# Patient Record
Sex: Male | Born: 1960 | Race: White | Hispanic: No | Marital: Married | Smoking: Former smoker
Health system: Southern US, Community
[De-identification: ages and names within clinical notes are randomized; demographics above are authoritative.]

## PROBLEM LIST (undated history)

## (undated) DIAGNOSIS — M7752 Other enthesopathy of left foot: Secondary | ICD-10-CM

## (undated) DIAGNOSIS — I371 Nonrheumatic pulmonary valve insufficiency: Secondary | ICD-10-CM

## (undated) DIAGNOSIS — I252 Old myocardial infarction: Secondary | ICD-10-CM

## (undated) DIAGNOSIS — I2510 Atherosclerotic heart disease of native coronary artery without angina pectoris: Secondary | ICD-10-CM

## (undated) DIAGNOSIS — F419 Anxiety disorder, unspecified: Secondary | ICD-10-CM

## (undated) DIAGNOSIS — Z9889 Other specified postprocedural states: Secondary | ICD-10-CM

## (undated) DIAGNOSIS — K5792 Diverticulitis of intestine, part unspecified, without perforation or abscess without bleeding: Secondary | ICD-10-CM

## (undated) DIAGNOSIS — I519 Heart disease, unspecified: Secondary | ICD-10-CM

## (undated) DIAGNOSIS — K219 Gastro-esophageal reflux disease without esophagitis: Secondary | ICD-10-CM

## (undated) DIAGNOSIS — E7800 Pure hypercholesterolemia, unspecified: Secondary | ICD-10-CM

## (undated) HISTORY — DX: Other specified postprocedural states: Z98.890

## (undated) HISTORY — DX: Heart disease, unspecified: I51.9

## (undated) HISTORY — DX: Anxiety disorder, unspecified: F41.9

## (undated) HISTORY — DX: Atherosclerotic heart disease of native coronary artery without angina pectoris: I25.10

## (undated) HISTORY — PX: CARDIAC SURGERY: SHX584

## (undated) HISTORY — DX: Nonrheumatic pulmonary valve insufficiency: I37.1

## (undated) HISTORY — PX: CARDIAC CATHETERIZATION: SHX172

## (undated) HISTORY — DX: Gastro-esophageal reflux disease without esophagitis: K21.9

## (undated) HISTORY — DX: Other enthesopathy of left foot and ankle: M77.52

---
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
## 1991-03-19 HISTORY — PX: OTHER SURGICAL HISTORY: SHX169

## 2012-05-16 DIAGNOSIS — I2510 Atherosclerotic heart disease of native coronary artery without angina pectoris: Secondary | ICD-10-CM

## 2012-05-16 HISTORY — DX: Atherosclerotic heart disease of native coronary artery without angina pectoris: I25.10

## 2012-06-03 ENCOUNTER — Ambulatory Visit: Payer: Self-pay | Admitting: Internal Medicine

## 2012-06-03 LAB — COMPREHENSIVE METABOLIC PANEL
Albumin: 3.9 g/dL (ref 3.4–5.0)
Alkaline Phosphatase: 84 U/L (ref 50–136)
Anion Gap: 7 (ref 7–16)
BUN: 13 mg/dL (ref 7–18)
Bilirubin,Total: 0.4 mg/dL (ref 0.2–1.0)
Co2: 25 mmol/L (ref 21–32)
EGFR (African American): >60
Potassium: 3.2 mmol/L — ABNORMAL LOW (ref 3.5–5.1)
SGPT (ALT): 35 U/L (ref 12–78)
Total Protein: 7.3 g/dL (ref 6.4–8.2)

## 2012-06-03 LAB — PROTIME-INR: Prothrombin Time: 12.4 secs (ref 11.5–14.7)

## 2012-06-03 LAB — CBC
MCH: 27.9 pg (ref 26.0–34.0)
MCV: 84 fL (ref 80–100)
RDW: 15 % — ABNORMAL HIGH (ref 11.5–14.5)

## 2012-06-03 LAB — TROPONIN I: Troponin-I: <0.02 ng/mL

## 2012-06-18 LAB — BASIC METABOLIC PANEL
BUN: 7 mg/dL (ref 4–21)
Creatinine: 1 mg/dL (ref 0.6–1.3)
GLUCOSE: 91 mg/dL
POTASSIUM: 3.5 mmol/L (ref 3.4–5.3)
Sodium: 139 mmol/L (ref 137–147)

## 2012-06-18 LAB — CBC AND DIFFERENTIAL
HCT: 38 % — AB (ref 41–53)
Hemoglobin: 12.8 g/dL — AB (ref 13.5–17.5)
PLATELETS: 249 10*3/uL (ref 150–399)

## 2012-06-18 LAB — TSH: TSH: 1.66 u[IU]/mL (ref 0.41–5.90)

## 2012-06-18 LAB — HEPATIC FUNCTION PANEL
ALT: 39 U/L (ref 10–40)
AST: 22 U/L (ref 14–40)

## 2012-06-29 ENCOUNTER — Encounter: Payer: Self-pay | Admitting: Cardiovascular Disease

## 2012-07-16 ENCOUNTER — Encounter: Payer: Self-pay | Admitting: Cardiovascular Disease

## 2012-07-26 DIAGNOSIS — I371 Nonrheumatic pulmonary valve insufficiency: Secondary | ICD-10-CM

## 2012-07-26 DIAGNOSIS — I519 Heart disease, unspecified: Secondary | ICD-10-CM

## 2012-07-26 HISTORY — DX: Heart disease, unspecified: I51.9

## 2012-07-26 HISTORY — DX: Nonrheumatic pulmonary valve insufficiency: I37.1

## 2012-07-28 DIAGNOSIS — Z9889 Other specified postprocedural states: Secondary | ICD-10-CM

## 2012-07-28 HISTORY — DX: Other specified postprocedural states: Z98.890

## 2012-08-19 ENCOUNTER — Encounter: Payer: Self-pay | Admitting: Cardiovascular Disease

## 2012-08-19 ENCOUNTER — Ambulatory Visit (INDEPENDENT_AMBULATORY_CARE_PROVIDER_SITE_OTHER): Payer: 59 | Admitting: Cardiovascular Disease

## 2012-08-19 VITALS — BP 110/72 | HR 56 | Ht 74.0 in | Wt 205.0 lb

## 2012-08-19 DIAGNOSIS — Z720 Tobacco use: Secondary | ICD-10-CM

## 2012-08-19 DIAGNOSIS — Z79899 Other long term (current) drug therapy: Secondary | ICD-10-CM

## 2012-08-19 DIAGNOSIS — I119 Hypertensive heart disease without heart failure: Secondary | ICD-10-CM

## 2012-08-19 DIAGNOSIS — E785 Hyperlipidemia, unspecified: Secondary | ICD-10-CM | POA: Insufficient documentation

## 2012-08-19 DIAGNOSIS — E782 Mixed hyperlipidemia: Secondary | ICD-10-CM

## 2012-08-19 DIAGNOSIS — I2510 Atherosclerotic heart disease of native coronary artery without angina pectoris: Secondary | ICD-10-CM

## 2012-08-19 DIAGNOSIS — F17200 Nicotine dependence, unspecified, uncomplicated: Secondary | ICD-10-CM

## 2012-08-19 DIAGNOSIS — E559 Vitamin D deficiency, unspecified: Secondary | ICD-10-CM

## 2012-08-19 DIAGNOSIS — I2119 ST elevation (STEMI) myocardial infarction involving other coronary artery of inferior wall: Secondary | ICD-10-CM

## 2012-08-19 MED ORDER — LISINOPRIL 2.5 MG PO TABS: 2.5000 mg | ORAL_TABLET | Freq: Every day | ORAL | Status: DC

## 2012-08-19 NOTE — Progress Notes (Signed)
Patient ID: Aaron Kelley, male   DOB: Mar 16, 1961, 52 y.o.   MRN: [id number]  PATIENT PROFILE: Aaron Kelley presents to the office for status with Cardiologic care with me following his recent inferior wall myocardial infarction and intervention done at [facility]. He is referred through the courtesy of Dr. Loma Sender.   HPI: Aaron Kelley denies any previous history of known coronary artery disease. 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 On 06/03/2012 while working kept experiencing recurrent episodes of chest discomfort. Ultimately, he was developing significant shortness of breath. After having chest pain off and on for most of the day he ultimately presented to [facility] and initial catheterization was done acutely by Dr Juliann Pares.  An attempted intervention to his subtotally occluded right coronary artery was unsuccessful and the patient was therefore transported to [facility] and subsequently underwent successful intervention with placement of two 3.0x24 mm Promus premier drug-eluting stents. He apparently was ultimately discharge on aspirin, atorvastatin 80 mg, and Lopressor but he develop significant issues with heaviness and fatigability. He saw the physician assistant on several occasions at [facility] and ultimately he was taken off his beta blocker therapy. An echo Doppler study was done on May 8 which showed an ejection fraction of approximately 50%. 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 Apparently several days later on Jul 28 2012 do to some recurrent chest discomfort and T-wave changes he underwent repeat cardiac catheterization at [facility]. This showed patent stents in the proximal RCA. He did have a long tubular 30% stenosis in the mid LAD. Other vessels were normal. The patient has subsequently seen Dr. Vear Clock. He now presents for cardiology referral for subsequent post-MI management and  care.  Past Medical History  Diagnosis Date  . Coronary artery disease 05/2012    MI tx. @ [facility] 2 stents placed     Past Surgical History  Procedure Laterality Date  . Rt. acl surgery  1993    [facility]  . Appendectomy  1972    Allergies not on file  Current Outpatient Prescriptions  Medication Sig Dispense Refill  . aspirin 81 MG tablet Take 81 mg by mouth daily.      Marland Kitchen atorvastatin (LIPITOR) 40 MG tablet Take 40 mg by mouth daily.      . clopidogrel (PLAVIX) 75 MG tablet Take 75 mg by mouth daily.      . clorazepate (TRANXENE) 7.5 MG tablet Take 7.5 mg by mouth 2 (two) times daily as needed for anxiety. prn      . lisinopril (PRINIVIL,ZESTRIL) 2.5 MG tablet Take 1 tablet (2.5 mg total) by mouth at bedtime.  30 tablet  6  . metoprolol tartrate (LOPRESSOR) 25 MG tablet Take 25 mg by mouth daily. 1/2 tablet      . nitroGLYCERIN (NITROSTAT) 0.4 MG SL tablet Place 0.4 mg under the tongue every 5 (five) minutes as needed for chest pain.       No current facility-administered medications for this visit.    Socially he is married. 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 Mostly, he did have a history of alcohol and drug use but he quit approximately 14 years ago.  Family History  Problem Relation Age of Onset  . Heart disease Father   . Heart attack Father   . Heart disease Brother   . Heart attack  Brother   . Heart disease Paternal Grandfather     ROS is negative for fever chills night sweats. He denies any further lightheadedness. He denies recent chest pain. He is unaware of palpitations. He denies wheezing. He denies abdominal pain. He denies bleeding. He denies rash. He denies myalgias. There are no paresthesias. Other system review is negative.  PE BP 110/72  Pulse 56  Ht 6\' 2"  (1.88 m)  Wt 205 lb (92.987 kg)  BMI 26.31 kg/m2 orthostatic blood pressure checks by me today reveal a supine blood pressure of 102/70 and  actually his blood pressure increased to 110/72 with standing. General: Alert, oriented, no distress.  HEENT: Normocephalic, atraumatic. Pupils round and reactive; sclera anicteric; Fundi no hemorrhages or exudates. Nose without nasal septal hypertrophy Mouth/Parynx benign; Mallinpatti scale 3 Neck: No JVD, no carotid briuts Lungs: Decreased breath sounds diffusely; no wheezing or rales Heart: RRR, s1 s2 normal 1/6 systolic murmur Abdomen: soft, nontender; no hepatosplenomehaly, BS+; abdominal aorta nontender and not dilated by palpation. Pulses 2+ catheterization sites well-healed. Extremities: no clubbinbg cyanosis or edema, Homan's sign negative  Neurologic: grossly nonfocal    ECG: Sinus bradycardia at 56 beats per minute. Evidence for old inferior infarct with Q wave in 3 T-wave inversion in 3 and F and also T-wave inversion V1 through V4 there  LABS:  BMET No results found for this basename: na, k, cl, co2, glucose, bun, creatinine, calcium, gfrnonaa, gfraa     Hepatic Function Panel  No results found for this basename: prot, albumin, ast, alt, alkphos, bilitot, bilidir, ibili     CBC No results found for this basename: wbc, rbc, hgb, hct, plt, mcv, mch, mchc, rdw, neutrabs, lymphsabs, monoabs, eosabs, basosabs     BNP No results found for this basename: probnp    Lipid Panel  No results found for this basename: chol, trig, hdl, cholhdl, vldl, ldlcalc     RADIOLOGY: No results found.   ASSESSMENT AND PLAN: A long discussion with Mr. Filip and his wife. I reviewed the records from [facility] well as [facility]. Aaron Kelley suffered an inferior wall myocardial infarction on 06/03/2012. Primary intervention was difficult but ultimately her failed attempt at [facility] he underwent insertion of 2 Ms. premier DES stents at [facility] into his proximal right coronary artery. The patient does have mild concomitant coronary artery disease with smooth  tubular narrowing of his middle LAD of 30%. I commended him on his smoking cessation. Presently, his blood pressure seems to be controlled and he does not have orthostatic symptoms. I'm electing to add a trial of very low dose ACE inhibition and will start him on lisinopril to take one half of a 2.5 mg for the next several evenings and if he tolerates this we will then increase this to 2.5 mg at bedtime. He is now on atorvastatin at 40 mg. I will check a complete set of laboratory and also an NMR lipoprotein to make certain he is meeting optimal management with reference to LDL particle #. In addition, since he is on aspirin and Plavix, I will check a P2Y12 assessment to make certain he is a good Plavix responder if not he may need to be changed to a different antiplatelet agent. I will see him back in the office in 6 weeks for followup evaluation    Lennette Bihari, MD, [facility] 08/19/2012 12:05 PM

## 2012-08-19 NOTE — Patient Instructions (Addendum)
Your physician recommends that you return for lab work.  Your physician recommends that you schedule a follow-up appointment in: 6 weeks.  Your physician has recommended you make the following change in your medication: start Lisinopril 2.5 mg. This has already been sent to your pharmacy.

## 2012-08-20 ENCOUNTER — Ambulatory Visit (HOSPITAL_COMMUNITY)
Admission: AD | Admit: 2012-08-20 | Discharge: 2012-08-20 | Disposition: A | Discharge status: Home / Self Care | Payer: 59 | Source: Ambulatory Visit | Attending: Cardiovascular Disease | Admitting: Cardiovascular Disease

## 2012-08-20 DIAGNOSIS — I252 Old myocardial infarction: Secondary | ICD-10-CM | POA: Insufficient documentation

## 2012-08-20 DIAGNOSIS — I2510 Atherosclerotic heart disease of native coronary artery without angina pectoris: Secondary | ICD-10-CM | POA: Insufficient documentation

## 2012-08-20 DIAGNOSIS — Z7902 Long term (current) use of antithrombotics/antiplatelets: Secondary | ICD-10-CM | POA: Insufficient documentation

## 2012-08-20 DIAGNOSIS — Z79899 Other long term (current) drug therapy: Secondary | ICD-10-CM | POA: Insufficient documentation

## 2012-08-20 DIAGNOSIS — Z7982 Long term (current) use of aspirin: Secondary | ICD-10-CM | POA: Insufficient documentation

## 2012-08-20 LAB — COMPREHENSIVE METABOLIC PANEL
BUN: 12 mg/dL (ref 6–23)
CO2: 23 mEq/L (ref 19–32)
Calcium: 9.3 mg/dL (ref 8.4–10.5)
Creatinine, Ser: 1.12 mg/dL (ref 0.50–1.35)
GFR calc Af Amer: 84 mL/min — ABNORMAL LOW (ref >90)
GFR calc non Af Amer: 73 mL/min — ABNORMAL LOW (ref >90)
Glucose, Bld: 123 mg/dL — ABNORMAL HIGH (ref 70–99)

## 2012-08-20 LAB — CBC
Hemoglobin: 14.1 g/dL (ref 13.0–17.0)
MCH: 28.1 pg (ref 26.0–34.0)
MCV: 81.9 fL (ref 78.0–100.0)
Platelets: 152 10*3/uL (ref 150–400)
RBC: 5.02 MIL/uL (ref 4.22–5.81)

## 2012-08-20 LAB — VITAMIN D 25 HYDROXY (VIT D DEFICIENCY, FRACTURES): Vit D, 25-Hydroxy: 50 ng/mL (ref 30–89)

## 2012-08-25 ENCOUNTER — Telehealth: Payer: Self-pay | Admitting: Cardiovascular Disease

## 2012-08-25 LAB — MISCELLANEOUS TEST

## 2012-08-25 NOTE — Telephone Encounter (Signed)
Pt calling about lab results  

## 2012-08-25 NOTE — Telephone Encounter (Signed)
Message forwarded to W. Waddell, CMA.  

## 2012-08-26 ENCOUNTER — Telehealth: Payer: Self-pay | Admitting: *Deleted

## 2012-08-26 NOTE — Telephone Encounter (Signed)
Called patient to inform him that Dr. Tresa Endo hasn't given me his lab results. While speaking with patient, he informed me that he is having problems with the lisinopril prescription that he was given. Recommended sooner follow up appointment to evaluate his symptoms. Said to patient that we could have him to see Dr. Tresa Endo or if not, then a extender. Patient stated in  No circumstances will he see a "PA". Told patient that's fine, i will have the scheduler to call him with appointment to see Dr. Tresa Endo. Saff message sent to Inetta Fermo to schedule patient appointment.

## 2012-08-26 NOTE — Telephone Encounter (Signed)
Taken care of

## 2012-09-07 ENCOUNTER — Encounter: Payer: Self-pay | Admitting: Cardiovascular Disease

## 2012-09-07 ENCOUNTER — Ambulatory Visit (INDEPENDENT_AMBULATORY_CARE_PROVIDER_SITE_OTHER): Payer: 59 | Admitting: Cardiovascular Disease

## 2012-09-07 VITALS — BP 110/60 | HR 53 | Ht 73.5 in | Wt 203.1 lb

## 2012-09-07 DIAGNOSIS — I2510 Atherosclerotic heart disease of native coronary artery without angina pectoris: Secondary | ICD-10-CM

## 2012-09-07 NOTE — Patient Instructions (Signed)
Your physician recommends that you schedule a follow-up appointment in: 3 months  No changes have been made today.

## 2012-09-14 ENCOUNTER — Encounter: Payer: Self-pay | Admitting: Cardiovascular Disease

## 2012-09-14 NOTE — Progress Notes (Signed)
Patient ID: Aaron Kelley, male   DOB: January 24, 1961, 52 y.o.   MRN: [id number]     HPI: Aaron Kelley presents to the office today in followup of my initial cardiology evaluation on  08/19/2012.  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 After having chest pain off and on for most of the day he ultimately presented to [facility] and initial catheterization was done acutely by Dr Juliann Pares.  An attempted intervention to his subtotally occluded right coronary artery was unsuccessful and the patient was therefore transported to [facility] and subsequently underwent successful intervention with placement of two 3.0x24 mm Promus premier drug-eluting stents. He apparently was ultimately discharge on aspirin, atorvastatin 80 mg, and Lopressor but he develop significant issues with heaviness and fatigability. He saw the physician assistant on several occasions at [facility] and ultimately he was taken off his beta blocker therapy. An echo Doppler study was done on May 8 which showed an ejection fraction of approximately 50%. 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 Apparently several days later on Jul 28 2012 do to some recurrent chest discomfort and T-wave changes he underwent repeat cardiac catheterization at [facility]. This showed patent stents in the proximal RCA. He did have a long tubular 30% stenosis in the mid LAD. Other vessels were normal. The patient had subsequently seen Dr. Loma Sender and was referred to me for subsequent post-MI management and care.  When I saw him on June 4 I did recommend an initial trial of ACE inhibitor and started him on low-dose lisinopril. Apparently he could not tolerate this small dose and experienced some recurrent chest pain. I did refer him for an NMR   lipoprofile  which revealed a calculated LDL of 41 with an LDL particle number of 711. HDL cholesterol was low at 34 and his HDL particle number remains low at 24.9. Triglycerides are excellent at 80. Insulin resistance was slightly elevated at 52. Hemoglobin was 14.1 hematocrit 41.1 BUN 12 a 1.12. Fasting glucose mildly elevated at 123. I also performed a  P2Y12 assessment of platelet function since he was on Plavix and this revealed adequate platelet response with a value of 191. D. level was normal at 50. TSH was 2.602.  Aaron Kelley well without recurrent chest pain or dizziness since he has been off lisinopril to he denies recent dizziness. Apparently when he was taking lisinopril he was working on extremely hot days suggesting the possibility of some hypotension in the etiology of his symptoms.   Past Medical History  Diagnosis Date  . Coronary artery disease 05/2012    MI tx. @ [facility] 2 stents placed     Past Surgical History  Procedure Laterality Date  . Rt. acl surgery  1993    [facility]  . Appendectomy  1972    Allergies  Allergen Reactions  . Lisinopril Other (See Comments)    Severe chest pain and fatigue    Current Outpatient Prescriptions  Medication Sig Dispense Refill  . aspirin 81 MG tablet Take 81 mg by mouth daily.      Marland Kitchen atorvastatin (LIPITOR) 40 MG tablet Take 40 mg by mouth daily.      . clopidogrel (PLAVIX) 75 MG tablet Take 75 mg by mouth daily.      . clorazepate (TRANXENE) 7.5 MG tablet  Take 7.5 mg by mouth 2 (two) times daily as needed for anxiety. prn      . metoprolol tartrate (LOPRESSOR) 25 MG tablet Take 12.5 mg by mouth daily.       . nitroGLYCERIN (NITROSTAT) 0.4 MG SL tablet Place 0.4 mg under the tongue every 5 (five) minutes as needed for chest pain.      . pantoprazole (PROTONIX) 40 MG tablet Take 1 tablet by mouth daily.       No current facility-administered medications for this visit.    Socially he is married. He works as a Printmaker. He  had smoked for 40 years but quit the day of his heart attack. Mostly, he did have a history of alcohol and drug use but he quit approximately 14 years ago.  Family History  Problem Relation Age of Onset  . Heart disease Father   . Heart attack Father   . Heart disease Brother   . Heart attack Brother   . Heart disease Paternal Grandfather     ROS is negative for fever chills night sweats. He denies any further lightheadedness. He denies recent chest pain. He is unaware of palpitations. He denies wheezing. He denies abdominal pain. He denies bleeding. He denies rash. He denies myalgias. There are no paresthesias. Other system review is negative.  PE BP 110/60  Pulse 53  Ht 6' 1.5" (1.867 m)  Wt 203 lb 1.6 oz (92.126 kg)  BMI 26.43 kg/m2 orthostatic blood pressure checks by me today reveal a supine blood pressure of 102/70 and actually his blood pressure increased to 110/72 with standing. General: Alert, oriented, no distress.  HEENT: Normocephalic, atraumatic. Pupils round and reactive; sclera anicteric; Fundi no hemorrhages or exudates. Nose without nasal septal hypertrophy Mouth/Parynx benign; Mallinpatti scale 3 Neck: No JVD, no carotid briuts Lungs: Decreased breath sounds diffusely; no wheezing or rales Heart: RRR, s1 s2 normal 1/6 systolic murmur Abdomen: soft, nontender; no hepatosplenomehaly, BS+; abdominal aorta nontender and not dilated by palpation. Pulses 2+ catheterization sites well-healed. Extremities: no clubbinbg cyanosis or edema, Homan's sign negative  Neurologic: grossly nonfocal    ECG: Sinus bradycardia at 53 beats per minute. Evidence for old inferior infarct with Q wave in 3 T-wave inversion in 3 and F and also T-wave inversion V1 through V4.  LABS:  BMET    Component Value Date/Time   NA 139 08/20/2012 0700     Hepatic Function Panel     Component Value Date/Time   PROT 6.7 08/20/2012 0700     CBC    Component Value Date/Time   WBC 5.0  08/20/2012 0700     BNP No results found for this basename: probnp    Lipid Panel  No results found for this basename: chol,  trig,  hdl,  cholhdl,  vldl,  ldlcalc     RADIOLOGY: No results found.   ASSESSMENT AND PLAN:  Mr. Foiles suffered an inferior wall myocardial infarction on 06/03/2012. Primary intervention was difficult but ultimately following a failed attempt at [facility] he underwent insertion of 2 DES premier  stents at [facility] into his proximal right coronary artery. The patient does have mild concomitant coronary artery disease with smooth tubular narrowing of his middle LAD of 30%.  Apparently he did not tolerate even a very low dose of  ACE inhibition and this may have been due to hypotension particularly in the setting of extreme heat and humidity while working.  He is now on atorvastatin at 40 mg. His  NMR lipoprotein reveals optimal management with reference to LDL particle number, but his HDL particle number remains low.  He is on dual antiplatelet therapy with low-dose aspirin plus Plavix and P2 Y12 testing has confirmed adequate Plavix responsiveness. He did have a repeat catheterization in May which confirmed patent stent. I did review his laboratory with him in detail. I will see him in 3 months for followup cardiology evaluation.   Lennette Bihari, MD, [facility] 09/14/2012 10:34 AM

## 2012-09-22 ENCOUNTER — Ambulatory Visit: Payer: 59 | Admitting: Cardiovascular Disease

## 2012-09-25 ENCOUNTER — Ambulatory Visit: Payer: 59 | Admitting: Cardiovascular Disease

## 2012-11-10 ENCOUNTER — Ambulatory Visit: Payer: Self-pay

## 2012-11-20 ENCOUNTER — Encounter: Payer: Self-pay | Admitting: Cardiovascular Disease

## 2012-11-20 ENCOUNTER — Ambulatory Visit (INDEPENDENT_AMBULATORY_CARE_PROVIDER_SITE_OTHER): Payer: 59 | Admitting: Cardiovascular Disease

## 2012-11-20 VITALS — BP 118/70 | HR 57 | Ht 73.5 in | Wt 206.8 lb

## 2012-11-20 DIAGNOSIS — E785 Hyperlipidemia, unspecified: Secondary | ICD-10-CM

## 2012-11-20 DIAGNOSIS — I2510 Atherosclerotic heart disease of native coronary artery without angina pectoris: Secondary | ICD-10-CM

## 2012-11-20 DIAGNOSIS — I2119 ST elevation (STEMI) myocardial infarction involving other coronary artery of inferior wall: Secondary | ICD-10-CM

## 2012-11-20 NOTE — Patient Instructions (Signed)
Your physician recommends that you return for lab work in: 6 months.   Your physician has requested that you have en exercise stress myoview. For further information please visit [email or web address] Please follow instruction sheet, as given. 6 MONTHS.  Your physician recommends that you schedule a follow-up appointment in: 6 months.

## 2012-11-20 NOTE — Progress Notes (Signed)
Patient ID: Aaron Kelley, male   DOB: [birth year], 52 y.o.   MRN: [id number]     HPI: Mr. Aaron Kelley presents to the office today in followup cardiology evaluation. 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 After having chest pain off and on for most of the day he ultimately presented to [facility] and initial catheterization was done acutely by Dr Juliann Pares.  An attempted intervention to his subtotally occluded right coronary artery was unsuccessful and the patient was therefore transported to [facility] and subsequently underwent successful intervention with placement of two 3.0x24 mm Promus premier drug-eluting stents. He apparently was ultimately discharge on aspirin, atorvastatin 80 mg, and Lopressor but he develop significant issues with heaviness and fatigability. He saw the physician assistant on several occasions at [facility] and ultimately he was taken off his beta blocker therapy. An echo Doppler study was done on May 8 which showed an ejection fraction of approximately 50%. 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 Apparently several days later on Jul 28 2012 due to  recurrent chest discomfort and T-wave changes he underwent repeat cardiac catheterization at [facility]. This showed patent stents in the proximal RCA. He did have a long tubular 30% stenosis in the mid LAD. Other vessels were normal. The patient had subsequently seen Dr. Loma Sender and was referred to me for subsequent post-MI management and care.  When I saw him on June 4 I did recommend an initial trial of ACE inhibitor and started him on low-dose lisinopril. Apparently he could not tolerate this small dose and experienced some recurrent chest pain. I did refer him for an NMR  lipoprofile  which revealed a calculated LDL of 41 with an LDL  particle number of 711. HDL cholesterol was low at 34 and his HDL particle number remains low at 24.9. Triglycerides are excellent at 80. Insulin resistance was slightly elevated at 52. Hemoglobin was 14.1 hematocrit 41.1 BUN 12 a 1.12. Fasting glucose mildly elevated at 123. I also performed a  P2Y12 assessment of platelet function since he was on Plavix and this revealed adequate platelet response with a value of 191. D. level was normal at 50. TSH was 2.602.  Since I last saw he has continued to do well. His blood pressure has remained in the low normal range. He denies recurrent chest pain. He denies dizziness. He denies tachycardia palpitations. He denies presyncope or syncope. He has had complaints with his left heel bone spur.  Past Medical History  Diagnosis Date  . Coronary artery disease 05/2012    MI tx. @ [facility] 2 stents placed     Past Surgical History  Procedure Laterality Date  . Rt. acl surgery  1993    [facility]  . Appendectomy  1972    Allergies  Allergen Reactions  . Lisinopril Other (See Comments)    Severe chest pain and fatigue    Current Outpatient Prescriptions  Medication Sig Dispense Refill  . aspirin 81 MG tablet Take 81 mg by mouth daily.      Marland Kitchen atorvastatin (LIPITOR) 40 MG tablet Take 40 mg by mouth daily.      . clopidogrel (PLAVIX) 75 MG tablet Take 75 mg by mouth daily.      . clorazepate (TRANXENE) 7.5 MG tablet Take 7.5 mg by mouth 2 (two) times daily as needed  for anxiety. prn      . metoprolol tartrate (LOPRESSOR) 25 MG tablet Take 12.5 mg by mouth daily.       . nitroGLYCERIN (NITROSTAT) 0.4 MG SL tablet Place 0.4 mg under the tongue every 5 (five) minutes as needed for chest pain.      . pantoprazole (PROTONIX) 40 MG tablet Take 1 tablet by mouth daily.       No current facility-administered medications for this visit.    Socially he is married. 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 Mostly,  he did have a history of alcohol and drug use but he quit approximately 14 years ago.  Family History  Problem Relation Age of Onset  . Heart disease Father   . Heart attack Father   . Heart disease Brother   . Heart attack Brother   . Heart disease Paternal Grandfather     ROS is negative for fever chills night sweats. He denies any further lightheadedness. He denies recent chest pain. He is unaware of palpitations. He denies wheezing. He denies abdominal pain. Is no change in bowel or bladder habits. He denies bleeding. He denies rash. He denies myalgias. There are no paresthesias. He does have bone spur in his left heel with significant discomfort. Other system review is negative.  PE BP 118/70  Pulse 57  Ht 6' 1.5" (1.867 m)  Wt 206 lb 12.8 oz (93.804 kg)  BMI 26.91 kg/m2   Repeat blood pressure by me today was 118/70 without orthostatic change General: Alert, oriented, no distress.  HEENT: Normocephalic, atraumatic. Pupils round and reactive; sclera anicteric; Fundi no hemorrhages or exudates. Nose without nasal septal hypertrophy Mouth/Parynx benign; Mallinpatti scale 3 Neck: No JVD, no carotid briuts Lungs: Decreased breath sounds diffusely; no wheezing or rales Heart: RRR, s1 s2 normal 1/6 systolic murmur Abdomen: soft, nontender; no hepatosplenomehaly, BS+; abdominal aorta nontender and not dilated by palpation. Pulses 2+ catheterization sites well-healed. Extremities: no clubbinbg cyanosis or edema, Homan's sign negative  Neurologic: grossly nonfocal    ECG: Sinus bradycardia at 57 beats per minute. Evidence for old inferior infarct with Q wave in 3.  T-wave inversion in 3 and F and also T-wave inversion V1 through V4.  LABS:  BMET    Component Value Date/Time   NA 139 08/20/2012 0700     Hepatic Function Panel     Component Value Date/Time   PROT 6.7 08/20/2012 0700     CBC    Component Value Date/Time   WBC 5.0 08/20/2012 0700     BNP No results found  for this basename: probnp    Lipid Panel  No results found for this basename: chol,  trig,  hdl,  cholhdl,  vldl,  ldlcalc     RADIOLOGY: No results found.   ASSESSMENT AND PLAN:  Mr. Trull suffered an inferior wall myocardial infarction on 06/03/2012. Primary intervention was difficult but ultimately successful at [facility] following a failed attempt at [facility] with insertion of 2 DES premier  stents into his proximal right coronary artery. The patient does have mild concomitant coronary artery disease with smooth tubular narrowing of his middle LAD of 30%.  Apparently he did not tolerate even a very low dose of  ACE inhibition and this may have been due to hypotension particularly in the setting of extreme heat and humidity while working.  He is now on atorvastatin at 40 mg. His  NMR lipoprotein reveals optimal management with reference to LDL particle number, but his HDL particle number remains low.  He is on dual antiplatelet therapy with low-dose aspirin plus Plavix and P2 Y12 testing has confirmed adequate Plavix responsiveness. He remained stable on his current medical regimen. In March 2015 I am recommending he undergo a one-year followup exercise Myoview scan following his initial MI. Prior to that office visit I will also check a CBC compressive metabolic panel, lipid panel and TSH we'll see him back in followup of the above studies and further recommendations will be made at that time.  Lennette Bihari, MD, [facility] 11/20/2012 5:41 PM

## 2013-01-12 ENCOUNTER — Telehealth: Payer: Self-pay | Admitting: Cardiovascular Disease

## 2013-01-12 DIAGNOSIS — E782 Mixed hyperlipidemia: Secondary | ICD-10-CM

## 2013-01-12 NOTE — Telephone Encounter (Signed)
Needs to talk to nurse about Lipitor  Having reactions  Please call  Faxing labwork from yesterday.

## 2013-01-12 NOTE — Telephone Encounter (Signed)
Returned call and pt verified x 2 w/ Jonna, pt's wife.  Stated pt had been having leg pain not long into taking Lipitor.  Stated pt's PCP told them to stop it and pt has not had any pain in his legs for the last 5 days he has been off Lipitor.  Stated they don't want to stop it w/o Dr. Landry Dyke orders, but he cannot take it.  Wants to know if Dr. Tresa Endo recommends something else or what he would like pt to do.  Wife informed Dr. Tresa Endo will be notified for further instructions.  Verbalized understanding and agreed w/ plan.  Message forwarded to Dr. Tresa Endo and labs on cart w/ this note.

## 2013-01-13 NOTE — Telephone Encounter (Signed)
Try crestor 10 mg  Daily or every other day; and recheck lab in 6 weeks if able to tolerate

## 2013-01-14 ENCOUNTER — Telehealth: Payer: Self-pay | Admitting: Cardiovascular Disease

## 2013-01-14 MED ORDER — ROSUVASTATIN CALCIUM 10 MG PO TABS: 10.0000 mg | ORAL_TABLET | Freq: Every day | ORAL | Status: DC

## 2013-01-14 NOTE — Telephone Encounter (Signed)
Dr Tresa Endo put on Crestor.  Cannot afford Crestor  Is there something else cheaper that might work.  Cannot take lipitor 40 mg bad leg and feet cramps

## 2013-01-14 NOTE — Telephone Encounter (Signed)
Returned call and informed pt per instructions by MD.  Pt stated he has never had problems w/ cholesterol and his PCP doesn't understand why he is on a cholesterol med.  Stated Dr. Tresa Endo told him the cholesterol med will help his heart heal and that's why.  Pt verbalized understanding and agreed w/ plan.  Rx sent to pharmacy.  Lab slip mailed.

## 2013-01-14 NOTE — Telephone Encounter (Signed)
Message forwarded to Dr. Kelly.

## 2013-01-26 NOTE — Telephone Encounter (Signed)
Try simva 20 mg

## 2013-01-27 NOTE — Telephone Encounter (Signed)
Spoke with patient informing him per Dr. Tresa Endo to try 20 mg of simvastatin. Patient states that he has been doing 10 mg until he heard back from [place]. He has a 40mg  tablet that he has been cutting into 1/4. informed him to take 1/2 of the 40 mg simvastatin tablet. If he develops muscle discomfort he can take 200-300 mg of CoQ10 over the counter.

## 2013-04-29 ENCOUNTER — Other Ambulatory Visit: Payer: Self-pay | Admitting: *Deleted

## 2013-04-29 ENCOUNTER — Encounter: Payer: Self-pay | Admitting: *Deleted

## 2013-04-29 DIAGNOSIS — I2510 Atherosclerotic heart disease of native coronary artery without angina pectoris: Secondary | ICD-10-CM

## 2013-05-06 ENCOUNTER — Telehealth: Payer: Self-pay | Admitting: Cardiovascular Disease

## 2013-05-06 ENCOUNTER — Other Ambulatory Visit: Payer: Self-pay | Admitting: *Deleted

## 2013-05-06 NOTE — Telephone Encounter (Signed)
Error

## 2013-05-06 NOTE — Telephone Encounter (Signed)
Ieft message on voicemail -mailed lab slip

## 2013-05-06 NOTE — Telephone Encounter (Signed)
Please send patient lab slips so he can have his lab work done prior to his visit with Dr. Claiborne Billings on 06/02/13

## 2013-05-18 ENCOUNTER — Ambulatory Visit (HOSPITAL_COMMUNITY)
Admission: RE | Admit: 2013-05-18 | Discharge: 2013-05-18 | Disposition: A | Discharge status: Home / Self Care | Payer: 59 | Source: Ambulatory Visit | Attending: Cardiovascular Disease | Admitting: Cardiovascular Disease

## 2013-05-18 DIAGNOSIS — I2510 Atherosclerotic heart disease of native coronary artery without angina pectoris: Secondary | ICD-10-CM | POA: Insufficient documentation

## 2013-05-18 MED ORDER — TECHNETIUM TC 99M SESTAMIBI GENERIC - CARDIOLITE
10.0000 | Freq: Once | INTRAVENOUS | Status: AC | PRN
  Administered 2013-05-18: 10 via INTRAVENOUS

## 2013-05-18 MED ORDER — TECHNETIUM TC 99M SESTAMIBI GENERIC - CARDIOLITE
30.0000 | Freq: Once | INTRAVENOUS | Status: AC | PRN
  Administered 2013-05-18: 30 via INTRAVENOUS

## 2013-05-18 NOTE — Procedures (Addendum)
[place] [address] [place] [address] [phone]  Cardiology Nuclear Med Study  Aaron Kelley is a 53 y.o. male     MRN : [id number]     DOB: 11-01-1960  Procedure Date: 05/18/2013  Nuclear Med Background Indication for Stress Test:  Stent Patency History:  CAD;MI-06/03/2012;STENT/PTCA;LAD disease Cardiac Risk Factors: Family History - CAD, History of Smoking, Lipids and Overweight  Symptoms:  Chest Pain, DOE and Fatigue   Nuclear Pre-Procedure Caffeine/Decaff Intake:  7:00pm NPO After: 5:00am   IV Site: R Forearm  IV 0.9% NS with Angio Cath:  22g  Chest Size (in):  44"  IV Started by: Azucena Cecil, RN  Height: 6' 1.5" (1.867 m)  Cup Size: n/a  BMI:  Body mass index is 26.81 kg/(m^2). Weight:  206 lb (93.441 kg)   Tech Comments:  n/a    Nuclear Med Study 1 or 2 day study: 1 day  Stress Test Type:  Stress  Order Authorizing Provider:  Shelva Majestic, MD   Resting Radionuclide: Technetium 2m Sestamibi  Resting Radionuclide Dose: 10.0 mCi   Stress Radionuclide:  Technetium 42m Sestamibi  Stress Radionuclide Dose: 29.9 mCi           Stress Protocol Rest HR: 65 Stress HR: 151  Rest BP: 121/91 Stress BP: 172/78  Exercise Time (min): 10:00 METS: 10.60   Predicted Max HR: 166 bpm % Max HR: 90.96 bpm Rate Pressure Product: 37902  Dose of Adenosine (mg):  n/a Dose of Lexiscan: n/a mg  Dose of Atropine (mg): n/a Dose of Dobutamine: n/a mcg/kg/min (at max HR)  Stress Test Technologist: Mellody Memos, CCT Nuclear Technologist: Otho Perl, CNMT   Rest Procedure:  Myocardial perfusion imaging was performed at rest 45 minutes following the intravenous administration of Technetium 95m Sestamibi. Stress Procedure:  The patient performed treadmill exercise using a Bruce  Protocol for 10 minutes. The patient stopped due to fatigue and shortness of breath. Patient denied any chest pain.  There were no significant  ST-T wave changes.  Technetium 26m Sestamibi was injected at peak exercise and myocardial perfusion imaging was performed after a brief delay.  Transient Ischemic Dilatation (Normal <1.22):  0.54 Lung/Heart Ratio (Normal <0.45):  0.32 QGS EDV:  103 ml QGS ESV:  39 ml LV Ejection Fraction: 63%  .    Rest ECG: NSR, poor R wave progression, minor IVCD  Stress ECG: No significant change from baseline ECG  QPS Raw Data Images:  Normal; no motion artifact; normal heart/lung ratio. Stress Images:  There is decreased uptake in the inferior wall. Rest Images:  Comparison with the stress images reveals no significant change. Subtraction (SDS):  No evidence of ischemia. LV Wall Motion:  Normal LV function. Very mild inferobasal hypokinesis.  Impression Exercise Capacity:  Good exercise capacity. BP Response:  Normal blood pressure response. Clinical Symptoms:  No significant symptoms noted. ECG Impression:  No significant ST segment change suggestive of ischemia. Comparison with Prior Nuclear Study: No previous nuclear study performed   Overall Impression:  Low risk stress nuclear study with a small fixed inferobasal defect - possibly a small scar versus diaphragmatic attenuation artifact. No ischemia is seen.Sanda Klein, MD  05/18/2013 12:21 PM

## 2013-06-02 ENCOUNTER — Ambulatory Visit (INDEPENDENT_AMBULATORY_CARE_PROVIDER_SITE_OTHER): Payer: 59 | Admitting: Cardiovascular Disease

## 2013-06-02 VITALS — BP 119/78 | HR 72 | Ht 73.5 in | Wt 207.5 lb

## 2013-06-02 DIAGNOSIS — R0609 Other forms of dyspnea: Secondary | ICD-10-CM

## 2013-06-02 DIAGNOSIS — Z720 Tobacco use: Secondary | ICD-10-CM

## 2013-06-02 DIAGNOSIS — F17200 Nicotine dependence, unspecified, uncomplicated: Secondary | ICD-10-CM

## 2013-06-02 DIAGNOSIS — R0989 Other specified symptoms and signs involving the circulatory and respiratory systems: Secondary | ICD-10-CM

## 2013-06-02 DIAGNOSIS — G4730 Sleep apnea, unspecified: Secondary | ICD-10-CM

## 2013-06-02 DIAGNOSIS — E785 Hyperlipidemia, unspecified: Secondary | ICD-10-CM

## 2013-06-02 DIAGNOSIS — R0683 Snoring: Secondary | ICD-10-CM

## 2013-06-02 DIAGNOSIS — I2119 ST elevation (STEMI) myocardial infarction involving other coronary artery of inferior wall: Secondary | ICD-10-CM

## 2013-06-29 ENCOUNTER — Telehealth: Payer: Self-pay | Admitting: Cardiovascular Disease

## 2013-06-29 NOTE — Telephone Encounter (Signed)
Calling about a sleep that was to set up for him. It will be 4wks since he has seen Dr.Kelly and said that he was told that we would be in touch with him to set up that study. Please Call   Thanks

## 2013-06-29 NOTE — Telephone Encounter (Signed)
Message forwarded to Scheduling to contact pt r/t appt.  

## 2013-07-04 ENCOUNTER — Encounter: Payer: Self-pay | Admitting: Cardiovascular Disease

## 2013-07-04 DIAGNOSIS — G4730 Sleep apnea, unspecified: Secondary | ICD-10-CM | POA: Insufficient documentation

## 2013-07-04 NOTE — Progress Notes (Signed)
Patient ID: Aayden Cefalu, male   DOB: 11-23-1960, 53 y.o.   MRN: [id number]      HPI: Mr. Swanger presents to the office today for 6 month followup cardiology evaluation.  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 After having chest pain off and on for most of the day he ultimately presented to [facility] and initial catheterization was done acutely by [name].  An attempted intervention to his subtotally occluded right coronary artery was unsuccessful and the patient was therefore transported to [facility] and subsequently underwent successful intervention with placement of two 3.0x24 mm Promus premier drug-eluting stents. He was ultimately discharge on aspirin, atorvastatin 80 mg, and Lopressor but he develop significant issues with  fatigability. He saw the physician assistant on several occasions at [facility] and ultimately he was taken off his beta blocker therapy. An echo Doppler study was done on May 8 which showed an ejection fraction of approximately 50%. 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 Several days later on Jul 28 2012 due to  recurrent chest discomfort and T-wave changes he underwent repeat cardiac catheterization at [facility]. This showed patent stents in the proximal RCA. He did have a long tubular 30% stenosis in the mid LAD. Other vessels were normal. The patient had subsequently seen Dr. [name] and was referred to me for subsequent post-MI management and care.  When I saw him on June 4 I did recommend an initial trial of ACE inhibitor and started him on low-dose lisinopril. He could not tolerate this small dose and experienced some recurrent chest pain. I did refer him for an NMR  lipoprofile  which revealed a calculated LDL of 41 with an LDL particle number of  711. HDL cholesterol was low at 34 and his HDL particle number remains low at 24.9. Triglycerides are excellent at 80. Insulin resistance was slightly elevated at 52. Hemoglobin was 14.1 hematocrit 41.1 BUN 12 a 1.12. Fasting glucose mildly elevated at 123. I also performed a  P2Y12 assessment of platelet function since he was on Plavix and this revealed adequate platelet response with a value of 191. Vitamin D. level was normal at 50. TSH was 2.602.  Since I last saw he has continued to do well. His blood pressure has remained in the low normal range. He denies recurrent chest pain. He denies dizziness. He denies tachycardia palpitations. He denies presyncope or syncope. He has had complaints with his left heel bone spur.  A nuclear perfusion  study was done on 05/19/2003.  This revealed good exercise capacity without chest pain or ECG changes was felt to be low risk with a small area of fixed inferobasal defect, suggesting of a small scar versus diaphragmatic attenuation.  There was no ischemia.  I did review blood work from 05/10/2013 done by Dr. [name].  Total cholesterol was 1:15, LDL cholesterol 54, HDL cholesterol, mildly low at 37, and triglycerides of 118.  Upon further questioning, and particularly according to the wife the patient is experiencing very loud snoring and at times.  Sleep is unbearable.  Due to his very loud snoring.  He typically wakes up numerous times per night.  Has significant fatigability.  His sleep is restless.  He also has had thrashing episodes while sleeping.  He denies recurrent chest tightness.  He denies shortness  of breath with activity.  He presents for evaluation.  Past Medical History  Diagnosis Date  . Coronary artery disease 05/2012    MI tx. @ [facility] 2 stents placed     Past Surgical History  Procedure Laterality Date  . Rt. acl surgery  1993    [facility]  . Appendectomy  1972    Allergies  Allergen Reactions  . Lisinopril Other (See  Comments)    Severe chest pain and fatigue    Current Outpatient Prescriptions  Medication Sig Dispense Refill  . aspirin 81 MG tablet Take 81 mg by mouth daily.      Marland Kitchen atorvastatin (LIPITOR) 20 MG tablet Take 20 mg by mouth daily.      . clopidogrel (PLAVIX) 75 MG tablet Take 75 mg by mouth daily.      . clorazepate (TRANXENE) 7.5 MG tablet Take 7.5 mg by mouth 2 (two) times daily as needed for anxiety. prn      . metoprolol tartrate (LOPRESSOR) 25 MG tablet Take 12.5 mg by mouth daily.       . nitroGLYCERIN (NITROSTAT) 0.4 MG SL tablet Place 0.4 mg under the tongue every 5 (five) minutes as needed for chest pain.      . pantoprazole (PROTONIX) 40 MG tablet Take 1 tablet by mouth daily.       No current facility-administered medications for this visit.    Socially he is married. He works as a Oceanographer. He had smoked for 40 years but quit the day of his heart attack. Mostly, he did have a history of alcohol and drug use but he quit approximately 14 years ago.  Family History  Problem Relation Age of Onset  . Heart disease Father   . Heart attack Father   . Heart disease Brother   . Heart attack Brother   . Heart disease Paternal Grandfather     ROS is negative for fever chills night sweats.  He denies change in weight.  He denies vision changes or hearing difficulty.  He is unaware of lymphadenopathy.  He denies any further lightheadedness. He denies recent chest pain. He is unaware of palpitations. He denies wheezing. He denies abdominal pain. Is no change in bowel or bladder habits. He denies bleeding. He denies rash. He denies myalgias. There are no paresthesias. He does have bone spur in his left heel with significant discomfort.  Sleep history is noteworthy for significant snoring, restlessness, frequent awakenings,  he denies significant residual daytime sleepiness. Other comprehensive 14 point system review is negative.  PE BP 119/78  Pulse 72  Ht 6' 1.5" (1.867 m)  Wt 207 lb  8 oz (94.121 kg)  BMI 27.00 kg/m2   Repeat blood pressure by me today was 118/70 without orthostatic change General: Alert, oriented, no distress.  HEENT: Normocephalic, atraumatic. Pupils round and reactive; sclera anicteric; Fundi no hemorrhages or exudates. Nose without nasal septal hypertrophy Mouth/Parynx benign; Mallinpatti scale 3 Neck: No JVD, no carotid bruits with normal carotid upstroke. Lungs: Decreased breath sounds diffusely; no wheezing or rales Chest wall: Nontender to palpation Heart: RRR, s1 s2 normal 1/6 systolic murmur Abdomen: soft, nontender; no hepatosplenomehaly, BS+; abdominal aorta nontender and not dilated by palpation. Pulses 2+ catheterization sites well-healed. Extremities: no clubbinbg cyanosis or edema, Homan's sign negative  Neurologic: grossly nonfocal Psychological: Normal affect and mood Back: No CVA tenderness   Prior 11/20/2012 ECG: Sinus bradycardia at 57 beats per minute. Evidence for old inferior infarct with Q wave in  3.  T-wave inversion in 3 and F and also T-wave inversion V1 through V4.  LABS:  BMET    Component Value Date/Time   NA 139 08/20/2012 0700     Hepatic Function Panel     Component Value Date/Time   PROT 6.7 08/20/2012 0700     CBC    Component Value Date/Time   WBC 5.0 08/20/2012 0700     BNP No results found for this basename: probnp    Lipid Panel  No results found for this basename: chol,  trig,  hdl,  cholhdl,  vldl,  ldlcalc     RADIOLOGY: No results found.   ASSESSMENT AND PLAN:   Mr. 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 Primary intervention was difficult but ultimately successful at [facility] following a failed attempt at [facility] with insertion of 2 DES premier  stents into his proximal right coronary artery. He does have mild concomitant coronary artery disease with smooth tubular narrowing of his middle LAD of 30%.  Apparently he did not tolerate even  a very low dose of  ACE inhibition and this may have been due to hypotension particularly in the setting of extreme heat and humidity while working.  He is now on atorvastatin at 40 mg. His NMR lipoprotein reveals optimal management with reference to LDL particle number, but his HDL particle number remains low.  He is on dual antiplatelet therapy with low-dose aspirin plus Plavix and P2 Y12 testing has confirmed adequate Plavix responsiveness. He remained stable on his current medical regimen.  His recent nuclear perfusion study only shows a small region of possible inferobasal scar without ischemia.  He had good exercise tolerance.  His recent blood work done by Dr. [name] is excellent.  Her that he may very well have obstructive sleep apnea based on his significant snoring, fatigability, frequent awakenings during sleep, and sleep thrashing.  Of scheduling him for a diagnostic polysomnogram.  If he does test positive for sleep apnea, will then see him back in a sleep clinic for followup evaluation after institution of CPAP therapy.  Troy Sine, MD, [facility] 07/04/2013 11:56 AM

## 2013-09-15 ENCOUNTER — Ambulatory Visit: Payer: 59 | Admitting: Cardiovascular Disease

## 2013-12-28 ENCOUNTER — Observation Stay: Payer: Self-pay | Admitting: Internal Medicine

## 2013-12-28 ENCOUNTER — Telehealth: Payer: Self-pay | Admitting: Cardiovascular Disease

## 2013-12-28 DIAGNOSIS — I200 Unstable angina: Secondary | ICD-10-CM

## 2013-12-28 LAB — BASIC METABOLIC PANEL
ANION GAP: 6 — AB (ref 7–16)
BUN: 13 mg/dL (ref 7–18)
CO2: 24 mmol/L (ref 21–32)
CREATININE: 1.23 mg/dL (ref 0.60–1.30)
Calcium, Total: 8.7 mg/dL (ref 8.5–10.1)
Chloride: 109 mmol/L — ABNORMAL HIGH (ref 98–107)
EGFR (African American): >60
Glucose: 95 mg/dL (ref 65–99)
OSMOLALITY: 277 (ref 275–301)
Potassium: 3.5 mmol/L (ref 3.5–5.1)
SODIUM: 139 mmol/L (ref 136–145)

## 2013-12-28 LAB — CBC
HCT: 42.7 % (ref 40.0–52.0)
HGB: 14.3 g/dL (ref 13.0–18.0)
MCH: 28.5 pg (ref 26.0–34.0)
MCHC: 33.5 g/dL (ref 32.0–36.0)
MCV: 85 fL (ref 80–100)
Platelet: 160 10*3/uL (ref 150–440)
RBC: 5.03 10*6/uL (ref 4.40–5.90)
RDW: 15.2 % — AB (ref 11.5–14.5)
WBC: 8.3 10*3/uL (ref 3.8–10.6)

## 2013-12-28 LAB — TROPONIN I
Troponin-I: <0.02 ng/mL
Troponin-I: <0.02 ng/mL
Troponin-I: <0.02 ng/mL

## 2013-12-28 NOTE — Telephone Encounter (Signed)
Close encounter 

## 2013-12-29 LAB — LIPID PANEL
CHOLESTEROL: 100 mg/dL (ref 0–200)
HDL Cholesterol: 27 mg/dL — ABNORMAL LOW (ref 40–60)
Ldl Cholesterol, Calc: 48 mg/dL (ref 0–100)
Triglycerides: 124 mg/dL (ref 0–200)
VLDL Cholesterol, Calc: 25 mg/dL (ref 5–40)

## 2013-12-30 ENCOUNTER — Telehealth: Payer: Self-pay

## 2013-12-30 ENCOUNTER — Encounter: Payer: Self-pay | Admitting: Cardiovascular Disease

## 2013-12-30 NOTE — Telephone Encounter (Signed)
Attempted to contact pt regarding discharge from [facility] on 01/06/14. Left detailed message asking pt to call back w/ any questions about his discharge instructions or medications.  Advised pt of appt w/ Christell Faith, PA on 01/06/14 @ 1:15.

## 2014-01-06 ENCOUNTER — Encounter: Payer: 59 | Admitting: Physician Assistant

## 2014-02-08 ENCOUNTER — Ambulatory Visit (INDEPENDENT_AMBULATORY_CARE_PROVIDER_SITE_OTHER): Payer: 59 | Admitting: Cardiovascular Disease

## 2014-02-08 ENCOUNTER — Encounter: Payer: Self-pay | Admitting: Cardiovascular Disease

## 2014-02-08 VITALS — BP 122/80 | HR 61 | Ht 74.0 in | Wt 216.9 lb

## 2014-02-08 DIAGNOSIS — I2510 Atherosclerotic heart disease of native coronary artery without angina pectoris: Secondary | ICD-10-CM

## 2014-02-08 DIAGNOSIS — I2119 ST elevation (STEMI) myocardial infarction involving other coronary artery of inferior wall: Secondary | ICD-10-CM

## 2014-02-08 DIAGNOSIS — E785 Hyperlipidemia, unspecified: Secondary | ICD-10-CM

## 2014-02-08 DIAGNOSIS — G4730 Sleep apnea, unspecified: Secondary | ICD-10-CM

## 2014-02-08 DIAGNOSIS — I2583 Coronary atherosclerosis due to lipid rich plaque: Secondary | ICD-10-CM

## 2014-02-08 NOTE — Patient Instructions (Signed)
Your physician has recommended you make the following change in your medication: increase the lopressor to 12.5 mg twice daily.if you cannot tolerate the increased dose please call back to notify.   Your physician wants you to follow-up in: 6 months or sooner if needed. You will receive a reminder letter in the mail two months in advance. If you don't receive a letter, please call our office to schedule the follow-up appointment.

## 2014-02-08 NOTE — Progress Notes (Signed)
Patient ID: Aaron Kelley, male   DOB: 07/26/1960, 53 y.o.   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  Mr. [name] developed recurrent episodes of chest discomfort associated with significant shortness of breath while at work on 06/03/2012. After having chest pain off and on for most of the day he ultimately presented to [facility] and initial catheterization was done acutely by [name].  An attempted intervention to his subtotally occluded RCA was unsuccessful and the patient was  transported to [facility] and subsequently underwent successful intervention with placement of two 3.0x24 mm Promus premier drug-eluting stents. He was ultimately discharge on aspirin, atorvastatin 80 mg, and Lopressor but he develop significant issues with  fatigability. He saw the physician assistant on several occasions at [facility] and ultimately he was taken off his beta blocker therapy. An echo Doppler study on Jul 23 2012  showed an ejection fraction of approximately 50%. 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 Several days later on Jul 28 2012 due to  recurrent chest discomfort and T-wave changes he underwent repeat cardiac catheterization at [facility]. This showed patent stents in the proximal RCA. He did have a long tubular 30% stenosis in the mid LAD. Other vessels were normal. The patient had subsequently seen Dr. [name] and was referred to me for subsequent post-MI management and care.  When I saw him on June 53 I did recommended an initial trial of ACE inhibitor and started him on low-dose lisinopril. He could not tolerate this small dose and experienced some recurrent chest pain. I did refer him for an NMR  lipoprofile  which revealed a calculated LDL of 41 with an LDL particle number of 711. HDL cholesterol was low  at 34 and his HDL particle number remains low at 24.9. Triglycerides are excellent at 80. Insulin resistance was slightly elevated at 52. Hemoglobin was 14.1 hematocrit 41.1 BUN 12 a 1.12. Fasting glucose mildly elevated at 123. I also performed a  P2Y12 assessment of platelet function since he was on Plavix and this revealed adequate platelet response with a value of 191. Vitamin D. level was normal at 50. TSH was 2.602.  Since I last saw he has continued to do well. His blood pressure has remained in the low normal range. He denies recurrent chest pain. He denies dizziness. He denies tachycardia palpitations. He denies presyncope or syncope. He has had complaints with his left heel bone spur.  A nuclear perfusion  study on 05/19/2003 revealed good exercise capacity without chest pain or ECG changes was felt to be low risk with a small area of fixed inferobasal defect, suggesting of a small scar versus diaphragmatic attenuation.  There was no ischemia.  I did review blood work from 05/10/2013 done by Dr. [name].  Total cholesterol was 115, LDL cholesterol 54, HDL cholesterol, mildly low at 37, and triglycerides of 118.  When I last saw him I was concerned about obstructive sleep apnea. His wife commented that he was experiencing very loud snoring.  He admitted that his sleep wa unbearable.  I strongly recommended a sleep evaluation but he has since still not pursued this.  He had recently developed an episode of chest pain while at work after he was extremely fatigued following a stomach virus which had resulted in significant  diarrhea and vomiting leading to dehydration.  He was evaluated at [facility] where he was admitted overnight.  Enzymes were negative.  He subsequent underwent another nuclear perfusion study on 12/29/2013.  He exercised for 8 minutes and 30 seconds and achieved a 10 minute workload and peak heart rate of 1 48 bpm.  Scintigraphic images again showed a  small to medium-sized mild to moderate intensity fixed perfusion defect in the basal to mid inferior wall consistent with his known prior inferior MI.  He presents now for follow-up evaluation.  If he denies any recurrent chest pain symptomatology since the most recent event.  He denies PND or orthopnea.  He never has had his sleep study.  Past Medical History  Diagnosis Date  . Coronary artery disease 05/2012    MI tx. @ [facility] 2 stents placed     Past Surgical History  Procedure Laterality Date  . Rt. acl surgery  1993    [facility]  . Appendectomy  1972    Allergies  Allergen Reactions  . Lisinopril Other (See Comments)    Severe chest pain and fatigue    Current Outpatient Prescriptions  Medication Sig Dispense Refill  . aspirin 81 MG tablet Take 81 mg by mouth daily.    Marland Kitchen atorvastatin (LIPITOR) 20 MG tablet Take 20 mg by mouth daily.    . clopidogrel (PLAVIX) 75 MG tablet Take 75 mg by mouth daily.    . clorazepate (TRANXENE) 7.5 MG tablet Take 7.5 mg by mouth 2 (two) times daily as needed for anxiety. prn    . metoprolol tartrate (LOPRESSOR) 25 MG tablet Take 12.5 mg by mouth daily.     . nitroGLYCERIN (NITROSTAT) 0.4 MG SL tablet Place 0.4 mg under the tongue every 5 (five) minutes as needed for chest pain.    . pantoprazole (PROTONIX) 40 MG tablet Take 1 tablet by mouth daily.     No current facility-administered medications for this visit.    Socially he is married. He works as a Oceanographer. He had smoked for 40 years but quit the day of his heart attack. Mostly, he did have a history of alcohol and drug use but he quit approximately 14 years ago.  Family History  Problem Relation Age of Onset  . Heart disease Father   . Heart attack Father   . Heart disease Brother   . Heart attack Brother   . Heart disease Paternal Grandfather     ROS General: Negative; No fevers, chills, or night sweats;  HEENT: Negative; No changes in vision or hearing, sinus congestion,  difficulty swallowing Pulmonary: Negative; No cough, wheezing, shortness of breath, hemoptysis Cardiovascular: see HPI GI: Negative; No nausea, vomiting, diarrhea, or abdominal pain GU: Negative; No dysuria, hematuria, or difficulty voiding Musculoskeletal: Negative; no myalgias, joint pain, or weakness Hematologic/Oncology: Negative; no easy bruising, bleeding Endocrine: Negative; no heat/cold intolerance; no diabetes Neuro: Negative; no changes in balance, headaches Skin: Negative; No rashes or skin lesions Psychiatric: Negative; No behavioral problems, depression Sleep: Very poor sleep with loud snoring, no daytime sleepiness, hypersomnolence, bruxism, restless legs, hypnogognic hallucinations, no cataplexy Other comprehensive 14 point system review is negative.   PE BP 122/80 mmHg  Pulse 61  Ht 6\' 2"  (1.88 m)  Wt 216 lb 14.4 oz (98.385 kg)  BMI 27.84 kg/m2   Repeat blood pressure by me today was 118/70 without orthostatic change General: Alert, oriented, no distress.  HEENT: Normocephalic, atraumatic. Pupils round and reactive; sclera anicteric;  Fundi no hemorrhages or exudates. Nose without nasal septal hypertrophy Mouth/Parynx benign; Mallinpatti scale 3 Neck: No JVD, no carotid bruits with normal carotid upstroke. Lungs: Decreased breath sounds diffusely; no wheezing or rales Chest wall: Nontender to palpation Heart: RRR, s1 s2 normal 1/6 systolic murmur; diastolic murmur.  No rubs thrills or heaves. Abdomen: soft, nontender; no hepatosplenomehaly, BS+; abdominal aorta nontender and not dilated by palpation. Pulses 2+ catheterization sites well-healed. Back: No CVA tenderness Extremities: no clubbinbg cyanosis or edema, Homan's sign negative  Neurologic: grossly nonfocal Psychological: Normal affect and mood  ECG (independently read by me): Sinus rhythm with PVC.  Old inferior by with Q waves in leads 3 and aVF.  Mild RV conduction delay.    Prior 11/20/2012 ECG: Sinus  bradycardia at 57 beats per minute. Evidence for old inferior infarct with Q wave in 3.  T-wave inversion in 3 and F and also T-wave inversion V1 through V4.  LABS:  BMET    Component Value Date/Time   NA 139 08/20/2012 0700     Hepatic Function Panel     Component Value Date/Time   PROT 6.7 08/20/2012 0700     CBC    Component Value Date/Time   WBC 5.0 08/20/2012 0700     BNP No results found for: PROBNP  Lipid Panel  No results found for: CHOL   RADIOLOGY: No results found.   ASSESSMENT AND PLAN:   Mr. Wahba suffered an inferior wall myocardial infarction on 06/03/2012. Primary intervention was difficult but ultimately successful at [facility] following a failed attempt at [facility] with insertion of 2 DES Premier  stents into his proximal RCA.  He has mild concomitant coronary artery disease with smooth tubular narrowing of his middle LAD of 30%.  He did not tolerate even a very low dose of  ACE inhibition and this may have been due to hypotension particularly in the setting of extreme heat and humidity while working.  He continues to be on dual antiplatelet therapy with aspirin and Plavix.  Post MI he is on very low-dose beta-blockade and has only been taking metoprolol 12.5 mg daily.  I recommended that he try taking this 12.5 twice a day if possible depending upon blood pressure and heart rate.  I personally reviewed his hospital records  from [facility] as well as his nuclear perfusion study.  His most recent study is unchanged from the prior study that we had done earlier this year in March 2015.  I again discussed the adverse consequences of obstructive sleep apnea if left untreated.  He has been deferring doing this due to financial issues, particularly with 2 children in college.  He is on lipid lowering therapy with atorvastatin 20 mg.  He denies any recent bleeding.  He denies GERD symptoms presently.  I will see him in 6 months for cardiology  evaluation.  Time spent: 30 minutes  Troy Sine, MD, [facility] 02/08/2014 5:21 PM

## 2014-02-09 DIAGNOSIS — I2510 Atherosclerotic heart disease of native coronary artery without angina pectoris: Secondary | ICD-10-CM | POA: Insufficient documentation

## 2014-02-14 ENCOUNTER — Telehealth: Payer: Self-pay | Admitting: Cardiovascular Disease

## 2014-02-14 NOTE — Telephone Encounter (Signed)
If cannot tolerate the bid dosing then only take daily

## 2014-02-14 NOTE — Telephone Encounter (Signed)
Pt said Dr Claiborne Billings doubled his Metoprolol,he says he just can not take that much. Please call to advise,he have stopped taking it.

## 2014-02-14 NOTE — Telephone Encounter (Signed)
Returned call to patient. He states he cannot take metoprolol tartrate 12.5mg  BID - his BP bottomed out and he was having extreme fatigue.   He has gone back to taking metoprolol tartrate 12.5mg  daily at 6pm. He reports his BP & HR are stable now.  Will defer to Dr. Claiborne Billings to advise if further medication changes are necessary

## 2014-02-15 NOTE — Telephone Encounter (Signed)
Spoke with patient & communicated information regarding taking metoprolol tartrate once daily per Dr. Claiborne Billings. Patient voiced understanding.

## 2014-02-15 NOTE — Telephone Encounter (Signed)
LMTCB

## 2014-02-17 ENCOUNTER — Encounter: Payer: Self-pay | Admitting: Cardiovascular Disease

## 2014-07-08 NOTE — H&P (Signed)
  DATE OF BIRTH:  [birth year]  REFERRING PHYSICIAN:  Dr. [name]  DATE OF ADMISSION:  06/03/2012  INDICATION: Acute inferior wall myocardial infarction, STEMI.  HISTORY OF PRESENT ILLNESS: The patient is a smoker, a 54 year old male. No significant past medical history, reasonably healthy. He states he has been in his normal state of health, started having on and off chest pain at about 11:30 a.m. The pain waxed and waned, but was not too bad. By about 4:00, the pain became severe, probably 7/10. He came to the Emergency Room about 4:30-ish, 1/2 hour to 1 hour before the patient got progressively worse. EKG in the Emergency Room showed ST elevation inferiorly, with reciprocal depressions laterally. He was hemodynamically stable, with a blood pressure of about 130, with adequate rhythm. The patient denies any history of bleeding. No illicit drug use. Again, he is a smoker. Because of his abnormal EKG, which meets criteria for a STEMI, he was recommend to go directly to cardiac cath lab, with intention to hopefully proceed with angioplasty and stenting.   REVIEW OF SYSTEMS:  Essentially negative, as per the patient.   FAMILY HISTORY:  Noncontributory.   SOCIAL HISTORY: Smoker. Works as a Development worker, international aid. Married. No significant alcohol consumption.   MEDICATIONS:  He states none.   ALLERGIES:  None.   PHYSICAL EXAMINATION: VITAL SIGNS:  Blood pressure is 130/80, pulse 75, respiratory rate 16, afebrile.  GENERAL:  The patient was reasonably comfortable.  HEENT:  Normocephalic, atraumatic. Pupils equal, reactive to light.  NECK:  Supple. No significant JVD, bruits or adenopathy.  LUNGS: Exam was clear to auscultation and percussion. No significant wheezes, rhonchi or rales.  HEART EXAM:  Positive S4. Systolic ejection murmur at the apex. ABDOMINAL EXAM:  Positive bowel sounds. No rebound guarding or tenderness.  EXTREMITIES: Exam was normal. No cyanosis, clubbing, edema.  NEUROLOGIC:   Exam intact.  SKIN: Exam normal.   LABORATORY DATA:  Pending. Reportedly initial troponins were negative. White count was slightly elevated. Potassium was 3.2. EKG, again, normal sinus rhythm, rate of 75, 3 mm ST elevation inferolaterally, reciprocal depression laterally.   ASSESSMENT: Acute inferior myocardial infarction, STEMI. Borderline hypertension. Smoking. Abnormal EKG.   PLAN:  Agree with taking the patient directly to cardiac cath lab, with intention to treat. Proceed with cardiac cath, with plan to intervene to relieve the possibly high-grade stenosis of right coronary artery with clot. Will place on anticoagulation. He should get heparin and aspirin in the Emergency Room. Consider Plavix or Brilinta prior to cardiac cath. Again, if a significant lesion, will try to balloon angioplasty and stent it if at all possible in the interim. Post-procedure he will probably need lipid management. Will probably be maintained on Plavix or Brilinta. Hopefully the patient will not have significant multiple lesions that may require bypass. In the meantime, will continue to treat the patient accordingly.     ____________________________ Loran Senters [name], MD ddc:mr D: 06/03/2012 18:50:16 ET T: 06/03/2012 19:10:14 ET JOB#: [id number]  cc: Cadarius Nevares D. [name], MD, <Dictator> Yolonda Kida MD ELECTRONICALLY SIGNED 06/29/2012 7:08

## 2014-07-09 NOTE — Discharge Summary (Signed)
PATIENT NAME:  Aaron Kelley, Aaron Kelley MR#:  [id number] DATE OF BIRTH:  [birth year]  DATE OF ADMISSION:  12/28/2013 DATE OF DISCHARGE:  12/29/2013  PRESENTING COMPLAINT: Chest pain.   DISCHARGE DIAGNOSES:  1.  Unstable angina, resolved.  2.  Coronary artery disease status post stent in the past.   CODE STATUS: Full code.   DISCHARGE MEDICATIONS:   1.  Plavix 75 mg daily.  2.  Metoprolol 12.5 mg p.o. daily.  3.  Lipitor 20 mg p.o. at bedtime.  4.  Aspirin 81 mg daily.  5.  Protonix 20 mg p.o. daily.  6.  Tranxene 7.5 mg p.o. daily.    DISCHARGE INSTRUCTIONS:   1.  Diet, low-fat, low-cholesterol.  2.  Follow up with Dr. Rockey Situ in 2-4 weeks.  3.  Follow up with Dr. [name] in 2-4 weeks.   LABORATORY DATA:  Cardiac stress test essentially was negative for ischemia. EF was 64%. Global left ventricular systolic function is negative. Lipid profile within normal limits. Cardiac enzymes x 3 negative.   HOSPITAL COURSE:  Aaron Kelley is a 54 year old Caucasian gentleman with history of coronary artery disease status post stenting in the past, came in with some chest pain. He was admitted with suspect unstable angina. His serial cardiac enzymes remained negative. He remained on aspirin, nitroglycerin and beta blockers, underwent Myoview stress test, nothing acute.  Dr. Ellyn Hack saw the patient, Dr. Ellyn Hack did the Myoview and it essentially remained negative. The patient did not have any further chest pain and was discharged home.   Heartburn resolved after GI cocktail.   CODE STATUS: Full code.   TIME SPENT: 40 minutes.    ____________________________ Hart Rochester Posey Pronto, MD sap:bu D: 01/03/2014 12:19:02 ET T: 01/03/2014 13:45:57 ET JOB#: [id number]  cc: Ryott Rafferty A. Posey Pronto, MD, <Dictator> Minna Merritts, MD Morton Peters., MD Ilda Basset MD ELECTRONICALLY SIGNED 01/03/2014 15:56

## 2014-07-09 NOTE — H&P (Signed)
PATIENT NAME:  Aaron Kelley, Aaron Kelley MR#:  [id number] DATE OF BIRTH:  [birth year]  DATE OF ADMISSION:  12/28/2013  PRIMARY CARE PHYSICIAN:  Dr. [name].   CARDIOLOGY:  Dr. Shelva Majestic at [facility].    REQUESTING PHYSICIAN:  Dr. Nance Pear.    CHIEF COMPLAINT: Chest pain.   HISTORY OF PRESENT ILLNESS: The patient is a 54 year old male with a known history of coronary artery disease status post stenting x 2 or 3 back in March of 2014. He is being admitted for unstable angina. The patient has been having nausea, vomiting, diarrhea since Sunday and was having really bad heartburn. He took multiple Rolaids without much relief. This morning when he woke up he was having a sharp chest pain. He took 1 nitroglycerin around 8:30 sublingually, that improved his pain. The pain came right back, took another nitroglycerin around 9:15, that also helped him some.  A short time later he had chest pain again, so required another nitroglycerin, about 3 nitroglycerin sublingually within an hour and a half duration. The pain was sharp, stabbing, intermittent in nature, associated with some shortness of breath. He denies any cough or fever. Considering his underlying coronary disease he decided to come to the Emergency Department. While in the ED he remains chest pain-free, but considering his extensive history and significant family history is being admitted for further evaluation and management.   PAST MEDICAL HISTORY:  Coronary artery disease, status post stenting in 2014 in March at [facility]  x 2-3.   FAMILY HISTORY: Positive for extensive coronary disease in the family.   SOCIAL HISTORY: No smoking. No alcohol. He works as a Water engineer.    ALLERGIES: No known drug allergies.        MEDICATIONS AT HOME:   1.  Tranxene 7.5 mg p.o. daily.   2.  Protonix 20 mg p.o. daily.   3.  Plavix 75 mg p.o. daily.  4.  Metoprolol 12.5 mg p.o. daily.  5.  Lipitor 20 mg p.o. daily.  6.  Aspirin  81 mg p.o. daily.   REVIEW OF SYSTEMS:  CONSTITUTIONAL: No fever, fatigue, weakness.  EYES: No blurred or double vision.  EARS, NOSE, AND THROAT: No tinnitus.  RESPIRATORY: No cough, wheezing, hemoptysis.  CARDIOVASCULAR: Positive for chest pain associated with shortness of breath. No orthopnea. No edema.  GASTROINTESTINAL:  He did have some nausea, vomiting, diarrhea, which has resolved now. He also reported some heartburn symptoms.   GENITOURINARY:  No dysuria, hematuria.  ENDOCRINE: No polyuria or nocturia.  HEMATOLOGIC: No anemia or easy bruising.  SKIN: No rash or lesion.  MUSCULOSKELETAL: No arthritis or muscle cramp.  NEUROLOGIC: No tingling, numbness, weakness.   PSYCHIATRIC:  No history of anxiety or depression.   PHYSICAL EXAMINATION:  VITAL SIGNS: Temperature 97.9, heart rate 51 per minute, respirations 18 per minute, blood pressure 118/79. He is saturating 99% on room air.  GENERAL: The patient is a 54 year old male lying in the bed comfortably, without any acute distress.  EYES: Pupils equal, round, reactive to light and accommodation. No scleral icterus. Extraocular muscles intact.  HEENT: Head atraumatic, normocephalic. Oropharynx and nasopharynx clear.  NECK: Supple. No jugular venous distention. No thyroid enlargement or tenderness.  LUNGS: Clear to auscultation bilaterally. No wheezing, rales, rhonchi, or crepitation.  CARDIOVASCULAR: S1, S2 normal. No murmur, rub, or gallop.  ABDOMEN: Soft, nontender, nondistended. Bowel sounds present. No organomegaly or mass.  EXTREMITIES: No peripheral edema, cyanosis, or clubbing.  NEUROLOGIC: Cranial nerves II-XII intact.  Muscle strength 5 out of 5 in extremities. Sensation intact. PSYCHIATRIC: The patient is alert and oriented x 3.  SKIN: No obvious rash, lesion, or ulcer.   MUSCULOSKELETAL: No joint effusion or tenderness.   LABORATORY PANEL:  Normal BMP. Normal CBC. Normal first set of troponin.   Chest x-ray in the ED  showed no acute cardiopulmonary disease.   EKG shows normal sinus rhythm with occasional PVCs, no major ST-T changes.   IMPRESSION AND PLAN:  1.  Suspect unstable angina. We will do serial troponins, but considering his underlying coronary disease with stenting along with strong family history we will admit him under observation, start him on aspirin, nitroglycerin, beta blocker. We will consult cardiology. I have discussed the case with Dr. Ida Rogue. The patient may need cardiac catheterization considering his extensive cardiac history and typical anginal symptoms. If troponin does trend or if the patient starts having pains again consider heparin drip.  2.  Heartburn. We will start him on Protonix.    CODE STATUS: Full code.   TOTAL TIME TAKING CARE OF THIS PATIENT: 40 minutes.     ____________________________ Lucina Mellow. Manuella Ghazi, MD vss:bu D: 12/28/2013 17:11:00 ET T: 12/28/2013 17:32:41 ET JOB#: [id number]  cc: Jeania Nater S. Manuella Ghazi, MD, <Dictator> Morton Peters., MD Minna Merritts, MD Lucina Mellow [facility] MD ELECTRONICALLY SIGNED 12/30/2013 15:25

## 2014-07-09 NOTE — Consult Note (Signed)
General Aspect 54 year old male with a known history of coronary artery disease status post stenting x 2 back in March of 2014 to teh RCA, admitted for unstable angina.  history of obstructive sleep apnea  He has been having nausea, vomiting, diarrhea since Sunday, with bad heartburn. He took multiple Rolaids without much relief. This morning when he woke up he was having a sharp chest pain. He took 1 nitroglycerin around 8:30 sublingually, that improved his pain. The pain came right back, took another nitroglycerin around 9:15, that also helped him some.  A short time later he had chest pain again, so required another nitroglycerin, about 3 nitroglycerin sublingually within an hour and a half duration. The pain was sharp, stabbing, intermittent in nature, associated with some shortness of breath. He denies any cough or fever. Considering his underlying coronary disease he decided to come to the Emergency Department.   While in the ED he remained chest pain-free.  managed as an outpatient by Dr. Ellouise Newer in [place]. Beta blockers held in the past secondary to fatigue Followup echocardiogram 07/23/2013 showing ejection fraction 50%, inferior hypokinesis, diastolic dysfunction Recurrent chest pain 07/28/2012 and underwent repeat cardiac catheterization showing patent stents in the proximal RCA. He does have a long tubular 30% stenosis in the mid LAD  Previously tried on ACE inhibitors in early June 2015. He did not tolerate even a small dose Blood pressure typically runs low Prior lab work showing LDL 41, LDL particle #711, HDL 25, fasting glucose 123, P2 Y. 12 assessment of platelet function showed adequate response without he 191  Nuclear perfusion study 05/18/2013 showed no ischemia   FAMILY HISTORY:  Positive for extensive coronary disease in the family.   SOCIAL HISTORY: No smoking. No alcohol. He works as a Water engineer.   Physical Exam:  GEN well developed, well nourished, no  acute distress   HEENT hearing intact to voice, moist oral mucosa   NECK supple    RESP normal resp effort  clear BS    CARD Regular rate and rhythm  No murmur    ABD denies tenderness  normal BS    LYMPH negative neck   EXTR negative edema   SKIN normal to palpation   NEURO motor/sensory function intact   PSYCH alert, A+O to time, place, person, good insight   Review of Systems:  Subjective/Chief Complaint Chest pain/angina   General: Weakness    Skin: No Complaints    ENT: No Complaints    Eyes: No Complaints    Neck: No Complaints    Respiratory: No Complaints    Cardiovascular: Chest pain or discomfort    Gastrointestinal: Heartburn  Nausea  Diarrhea    Genitourinary: No Complaints    Vascular: No Complaints    Musculoskeletal: No Complaints    Neurologic: No Complaints    Hematologic: No Complaints    Endocrine: No Complaints    Psychiatric: No Complaints    Review of Systems: All other systems were reviewed and found to be negative    Medications/Allergies Reviewed Medications/Allergies reviewed      Myocardial Infarct: 03-Jun-2012   None, patient reports no medical history.:    Tonsillectomy:    Leg Surgery - Left:        Admit Diagnosis:   CHEST PAIN: Onset Date: 28-Dec-2013, Status: Active, Description: CHEST PAIN  Home Medications: Medication Instructions Status  Plavix 75 mg oral tablet 1 tab(s) orally once a day Active  metoprolol 12.5 milligram(s) orally once a day  Active  Lipitor 20 mg oral tablet 1 tab(s) orally once a day (at bedtime) Active  Aspir 81 81 mg oral tablet 1 tab(s) orally once a day Active  Protonix 20 mg oral delayed release tablet 1 tab(s) orally once a day Active  Tranxene T-Tab 7.5 mg oral tablet 1 tab(s) orally once a day Active   Lab Results:  Routine Chem:  13-Oct-15 11:13   Glucose, Serum 95  BUN 13  Creatinine (comp) 1.23  Sodium, Serum 139  Potassium, Serum 3.5  Chloride, Serum  109  CO2,  Serum 24  Calcium (Total), Serum 8.7  Anion Gap  6  Osmolality (calc) 277  eGFR (African American) >60  eGFR (Non-African American) >60 (eGFR values <75m/min/1.73 m2 may be an indication of chronic kidney disease (CKD). Calculated eGFR, using the MRDR Study equation, is useful in  patients with stable renal function. The eGFR calculation will not be reliable in acutely ill patients when serum creatinine is changing rapidly. It is not useful in patients on dialysis. The eGFR calculation may not be applicable to patients at the low and high extremes of body sizes, pregnant women, and vetetarians.)  Cardiac:  13-Oct-15 11:13   Troponin I < 0.02 (0.00-0.05 0.05 ng/mL or less: NEGATIVE  Repeat testing in 3-6 hrs  if clinically indicated. >0.05 ng/mL: POTENTIAL  MYOCARDIAL INJURY. Repeat  testing in 3-6 hrs if  clinically indicated. NOTE: An increase or decrease  of 30% or more on serial  testing suggests a  clinically important change)  Routine Hem:  13-Oct-15 11:13   WBC (CBC) 8.3  RBC (CBC) 5.03  Hemoglobin (CBC) 14.3  Hematocrit (CBC) 42.7  Platelet Count (CBC) 160 (Result(s) reported on 28 Dec 2013 at 11:39AM.)  MCV 85  MCH 28.5  MCHC 33.5  RDW  15.2   EKG:  Interpretation EKG shows NSR with old inferior MI, PVC notes, no significant ST or T wave ABN   Radiology Results: XRay:    13-Oct-15 13:25, Chest PA and Lateral  Chest PA and Lateral   REASON FOR EXAM:    chest pain, left sided  COMMENTS:       PROCEDURE: DXR - DXR CHEST PA (OR AP) AND LATERAL  - Dec 28 2013  1:25PM     CLINICAL DATA:  Left-sided chest pain and shortness of breath.  Coronary artery disease.    EXAM:  CHEST  2 VIEW    COMPARISON:  06/03/2012    FINDINGS:  The heart size and mediastinal contours are within normal limits.  Both lungs are clear. The visualized skeletal structures are  unremarkable.     IMPRESSION:  Normal chest.      Electronically Signed    By: JRozetta Nunnery M.D.    On: 12/28/2013 13:40         Verified By: JLarey Seat M.D.,    No Known Allergies:   Vital Signs/Nurse's Notes:  **Vital Signs.:   13-Oct-15 17:49  Vital Signs Type Admission  Temperature Temperature (F) 97.3  Celsius 36.2  Temperature Source oral  Pulse Pulse 55  Respirations Respirations 18  Diastolic BP (mmHg) Diastolic BP (mmHg) 79  Mean BP 95  Pulse Ox % Pulse Ox % 93  Pulse Ox Activity Level  At rest  Oxygen Delivery Room Air/ 21 %    Impression 54year old male with a known history of coronary artery disease status post stenting x 2 back in March of 2014 to teh RCA, admitted for unstable  angina.  history of obstructive sleep apnea   1) Chest pain/angina: prior CAD and PCI in 2014 to the proximal RCA EKG relatively unchanged, troponin neg x 2 Long discussion with patient and wife, they do not want cardiac cath given previous groin hematomas, they prefer a treadmill myoview. This has been ordered for the AM --Will continue asa, plavix, lipitor (metoprolol on hold for stress test/treadmill) If negative stress, consider GI medical management (PPI, carafate, etc)   2) CAD, s/p PCI to RCA in 2014 continue current meds, as above  3) Anxiety: has outpt meds for anxiety. would continue as he is anxious about presentation.  4)hyperlipidemia: would continue lipitor  5) GERD: continue PPI at high dose   Electronic Signatures: Ida Rogue (MD)  (Signed 13-Oct-15 20:03)  Authored: General Aspect/Present Illness, History and Physical Exam, Review of System, Past Medical History, Health Issues, Home Medications, Labs, EKG , Radiology, Allergies, Vital Signs/Nurse's Notes, Impression/Plan   Last Updated: 13-Oct-15 20:03 by Ida Rogue (MD)

## 2014-08-16 ENCOUNTER — Ambulatory Visit (INDEPENDENT_AMBULATORY_CARE_PROVIDER_SITE_OTHER): Payer: 59 | Admitting: Cardiovascular Disease

## 2014-08-16 ENCOUNTER — Encounter: Payer: Self-pay | Admitting: Cardiovascular Disease

## 2014-08-16 VITALS — BP 110/80 | HR 73 | Ht 73.5 in | Wt 215.1 lb

## 2014-08-16 DIAGNOSIS — I25810 Atherosclerosis of coronary artery bypass graft(s) without angina pectoris: Secondary | ICD-10-CM | POA: Diagnosis not present

## 2014-08-16 DIAGNOSIS — E785 Hyperlipidemia, unspecified: Secondary | ICD-10-CM

## 2014-08-16 DIAGNOSIS — I493 Ventricular premature depolarization: Secondary | ICD-10-CM | POA: Insufficient documentation

## 2014-08-16 DIAGNOSIS — J3089 Other allergic rhinitis: Secondary | ICD-10-CM | POA: Insufficient documentation

## 2014-08-16 NOTE — Progress Notes (Signed)
Patient ID: Aaron Kelley, male   DOB: September 01, 1960, 54 y.o.   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  Aaron Kelley developed recurrent episodes of chest discomfort associated with significant shortness of breath while at work on 06/03/2012.  He ultimately presented to [facility] and initial catheterization was done acutely by [name].  An attempted intervention to his subtotally occluded RCA was unsuccessful and the patient was  transported to [facility] and subsequently underwent successful intervention with placement of two 3.0x24 mm Promus premier drug-eluting stents. He was discharged on aspirin, atorvastatin 80 mg, and Lopressor but he develop significant issues with  fatigability. He saw the physician assistant on several occasions at [facility] and ultimately he was taken off his beta blocker therapy. An echo Doppler study on Jul 23 2012  showed an ejection fraction of approximately 50%. 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 Several days later on Jul 28 2012 due to  recurrent chest discomfort and T-wave changes he underwent repeat cardiac catheterization at [facility]. This showed patent stents in the proximal RCA. He did have a long tubular 30% stenosis in the mid LAD. Other vessels were normal. The patient had subsequently seen Dr. [name] and was referred to me for subsequent post-MI management and care.  When I initially saw him on August 19, 2012 I  recommended an initial trial of ACE inhibitor and started him on low-dose lisinopril. He could not tolerate this small dose and experienced some recurrent chest pain. An NMR  lipoprofile  revealed a calculated LDL of 41 with an LDL particle number of 711. HDL cholesterol was low at 34 and his HDL particle number remains low at 24.9. Triglycerides are  excellent at 80. Insulin resistance was slightly elevated at 52. Hemoglobin was 14.1 hematocrit 41.1 BUN 12 a 1.12. Fasting glucose mildly elevated at 123. I also performed a  P2Y12 assessment of platelet function since he was on Plavix and this revealed adequate platelet response with a value of 191. Vitamin D. level was normal at 50. TSH was 2.602.  A nuclear perfusion  study on 05/18/2013 revealed good exercise capacity without chest pain or ECG changes was felt to be low risk with a small area of fixed inferobasal defect, suggesting of a small scar versus diaphragmatic attenuation.  There was no ischemia.  Bood work from 05/10/2013 done by Dr. [name]: Total cholesterol was 115, LDL cholesterol 54, HDL cholesterol, mildly low at 37, and triglycerides of 118.  When I saw him I was concerned about obstructive sleep apnea. His wife commented that he was experiencing very loud snoring.  He admitted that his sleep wa unbearable.  I strongly recommended a sleep evaluation but he has since still not pursued this.  He  developed an episode of chest pain while at work after he was extremely fatigued following a stomach virus which had resulted in significant diarrhea and vomiting leading to dehydration.  He was evaluated at [facility] where he was admitted overnight.  Enzymes were negative.  He subsequent underwent another nuclear perfusion study on 12/29/2013.  He exercised for 8 minutes and 30 seconds and achieved a 10 minute workload and peak heart rate of 1 48 bpm.  Scintigraphic images again showed a small to medium-sized mild to moderate intensity  fixed perfusion defect in the basal to mid inferior wall consistent with his known prior inferior MI.  Over the past months, he denies recurrent episodes of chest pain.  He is having some difficulty with allergies.  He typically blows his nose sometimes 25 times a morning.  Noticed some fatigability.  He has been on Lipitor 20 mg for  hyperlipidemia.  He continues to be on dual and a platelet therapy with aspirin and Plavix.  He is taking only metoprolol 12.5 mg at bedtime in addition to Protonix 40 mg for GERD.  He is now also working doing his house inspections rather than land surveying.  He presents for evaluation  Past Medical History  Diagnosis Date  . Coronary artery disease 05/2012    MI tx. @ [facility] 2 stents placed     Past Surgical History  Procedure Laterality Date  . Rt. acl surgery  1993    [facility]  . Appendectomy  1972    Allergies  Allergen Reactions  . Lisinopril Other (See Comments)    Severe chest pain and fatigue    Current Outpatient Prescriptions  Medication Sig Dispense Refill  . aspirin 81 MG tablet Take 81 mg by mouth daily.    Marland Kitchen atorvastatin (LIPITOR) 20 MG tablet Take 20 mg by mouth daily.    . clopidogrel (PLAVIX) 75 MG tablet Take 75 mg by mouth daily.    . clorazepate (TRANXENE) 7.5 MG tablet Take 7.5 mg by mouth 2 (two) times daily as needed for anxiety. prn    . metoprolol tartrate (LOPRESSOR) 25 MG tablet Take 12.5 mg by mouth 2 (two) times daily.    . nitroGLYCERIN (NITROSTAT) 0.4 MG SL tablet Place 0.4 mg under the tongue every 5 (five) minutes as needed for chest pain.    . pantoprazole (PROTONIX) 40 MG tablet Take 1 tablet by mouth daily.     No current facility-administered medications for this visit.    Socially he is married. He works as a Oceanographer. He had smoked for 40 years but quit the day of his heart attack. Mostly, he did have a history of alcohol and drug use but he quit approximately 14 years ago.  Family History  Problem Relation Age of Onset  . Heart disease Father   . Heart attack Father   . Heart disease Brother   . Heart attack Brother   . Heart disease Paternal Grandfather     ROS General: Negative; No fevers, chills, or night sweats;  HEENT: Negative; No changes in vision or hearing, sinus congestion, difficulty swallowing Pulmonary:  Negative; No cough, wheezing, shortness of breath, hemoptysis Cardiovascular: see HPI GI: Negative; No nausea, vomiting, diarrhea, or abdominal pain GU: Negative; No dysuria, hematuria, or difficulty voiding Musculoskeletal: Negative; no myalgias, joint pain, or weakness Hematologic/Oncology: Negative; no easy bruising, bleeding Endocrine: Negative; no heat/cold intolerance; no diabetes Neuro: Negative; no changes in balance, headaches Skin: Negative; No rashes or skin lesions Psychiatric: Negative; No behavioral problems, depression Sleep: Very poor sleep with loud snoring, no daytime sleepiness, hypersomnolence, bruxism, restless legs, hypnogognic hallucinations, no cataplexy Other comprehensive 14 point system review is negative.   PE BP 110/80 mmHg  Pulse 73  Ht 6' 1.5" (1.867 m)  Wt 215 lb 1.6 oz (97.569 kg)  BMI 27.99 kg/m2   Wt Readings from Last 3 Encounters:  08/16/14 215 lb 1.6 oz (97.569 kg)  02/08/14 216 lb 14.4 oz (98.385 kg)  06/02/13 207 lb 8 oz (94.121 kg)  General: Alert, oriented, no distress.  HEENT: Normocephalic, atraumatic. Pupils round and reactive; sclera anicteric; Fundi no hemorrhages or exudates. Nose without nasal septal hypertrophy Mouth/Parynx benign; Mallinpatti scale 3 Neck: No JVD, no carotid bruits with normal carotid upstroke. Lungs: Decreased breath sounds diffusely; no wheezing or rales Chest wall: Nontender to palpation Heart: RRR, s1 s2 normal 1/6 systolic murmur; diastolic murmur.  No rubs thrills or heaves. Abdomen: soft, nontender; no hepatosplenomehaly, BS+; abdominal aorta nontender and not dilated by palpation. Pulses 2+ catheterization sites well-healed. Back: No CVA tenderness Extremities: no clubbinbg cyanosis or edema, Homan's sign negative  Neurologic: grossly nonfocal Psychological: Normal affect and mood  ECG (independently read by me): Normal sinus rhythm at 73 bpm.  Occasional unifocal PVCs.  November 2015 ECG  (independently read by me): Sinus rhythm with PVC.  Old inferior by with Q waves in leads 3 and aVF.  Mild RV conduction delay.  Prior 11/20/2012 ECG: Sinus bradycardia at 57 beats per minute. Evidence for old inferior infarct with Q wave in 3.  T-wave inversion in 3 and F and also T-wave inversion V1 through V4.   BMP Latest Ref Rng 12/28/2013 08/20/2012 06/03/2012  Glucose 65-99 mg/dL 95 123(H) 162(H)  BUN 7-18 mg/dL '13 12 13  ' Creatinine 0.60-1.30 mg/dL 1.23 1.12 1.23  Sodium 136-145 mmol/L 139 139 137  Potassium 3.5-5.1 mmol/L 3.5 3.7 3.2(L)  Chloride 98-107 mmol/L 109(H) 108 105  CO2 21-32 mmol/L '24 23 25  ' Calcium 8.5-10.1 mg/dL 8.7 9.3 9.1   Hepatic Function Latest Ref Rng 08/20/2012 06/03/2012  Total Protein 6.0 - 8.3 g/dL 6.7 7.3  Albumin 3.5 - 5.2 g/dL 3.9 3.9  AST 0 - 37 U/L 21 22  ALT 0 - 53 U/L 23 35  Alk Phosphatase 39 - 117 U/L 61 84  Total Bilirubin 0.3 - 1.2 mg/dL 0.6 -   CBC Latest Ref Rng 12/28/2013 08/20/2012 06/03/2012  WBC 3.8-10.6 x10 3/mm 3 8.3 5.0 19.3(H)  Hemoglobin 13.0-18.0 g/dL 14.3 14.1 14.8  Hematocrit 40.0-52.0 % 42.7 41.1 44.5  Platelets 150-440 x10 3/mm 3 160 152 239   Lab Results  Component Value Date   TSH 2.602 08/20/2012  No results found for: HGBA1C  Lipid Panel  No results found for: CHOL, TRIG, HDL, CHOLHDL, VLDL, LDLCALC, LDLDIRECT  RADIOLOGY: No results found.   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 Primary intervention was difficult but ultimately successful at [facility] following a failed attempt at [facility] with insertion of 2 DES Premier  stents into his proximal RCA.  He has mild concomitant coronary artery disease with smooth tubular narrowing of his middle LAD of 30%.  He did not tolerate even a very low dose of  ACE inhibition and this may have been due to hypotension particularly in the setting of extreme heat and humidity while working.  He continues to be on  dual antiplatelet therapy with aspirin and Plavix.  His last nuclear perfusion study done at [facility] was unchanged and again showed a small to medium size mild to moderate intensity fixed perfusion defect in the basal to midinferior wall consistent with his known prior inferior MI without associated ischemia.  His EKG today shows occasional PVCs.  His resting pulse is in the 70s.  I'm attempting slight additional titration of his metoprolol, tartrate to 12.5 mg twice a day and hopefully he will be able to tolerate this.  The past.  He did not tolerate ace  inhibition.  I do feel he has a significant component of seasonal allergies.  I've suggested over-the-counter generic Zyrtec 10 mg daily during the seasonal time to improve his morning symptomatology.  His GERD is controlled with pantoprazole.  I have suggested follow-up lab work.  He will go to Dr. Hardin Negus' office to have this done.  I will see him in 6 months for cardiology reevaluation or sooner if problems arise.  Time spent: 25 minutes  Troy Sine, MD, [facility] 08/16/2014 5:59 PM

## 2014-08-16 NOTE — Patient Instructions (Signed)
Your physician has recommended you make the following change in your medication: increase the metoprolol succ to 12.5 twice a day.  Your physician recommends that you schedule a follow-up appointment in: 6 months.

## 2014-08-19 LAB — BASIC METABOLIC PANEL
BUN: 9 mg/dL (ref 4–21)
Creatinine: 1.1 mg/dL (ref 0.6–1.3)
Glucose: 120 mg/dL
Potassium: 4.4 mmol/L (ref 3.4–5.3)
Sodium: 140 mmol/L (ref 137–147)

## 2014-08-19 LAB — HEPATIC FUNCTION PANEL
ALK PHOS: 65 U/L (ref 25–125)
ALT: 18 U/L (ref 10–40)
AST: 26 U/L (ref 14–40)
Bilirubin, Total: 0.5 mg/dL

## 2014-08-19 LAB — CBC AND DIFFERENTIAL
HCT: 42 % (ref 41–53)
HEMOGLOBIN: 14.9 g/dL (ref 13.5–17.5)
NEUTROS ABS: 5 /uL
PLATELETS: 172 10*3/uL (ref 150–399)
WBC: 7.6 10^3/mL

## 2014-08-19 LAB — LIPID PANEL
CHOLESTEROL: 102 mg/dL (ref 0–200)
HDL: 32 mg/dL — AB (ref 35–70)
LDL CALC: 49 mg/dL
TRIGLYCERIDES: 104 mg/dL (ref 40–160)

## 2014-10-29 ENCOUNTER — Encounter: Payer: Self-pay | Admitting: Emergency Medicine

## 2014-10-29 ENCOUNTER — Emergency Department: Payer: 59

## 2014-10-29 ENCOUNTER — Emergency Department
Admission: EM | Admit: 2014-10-29 | Discharge: 2014-10-29 | Disposition: A | Discharge status: Home / Self Care | Payer: 59 | Attending: Emergency Medicine | Admitting: Emergency Medicine

## 2014-10-29 ENCOUNTER — Other Ambulatory Visit: Payer: Self-pay

## 2014-10-29 DIAGNOSIS — R079 Chest pain, unspecified: Secondary | ICD-10-CM | POA: Diagnosis present

## 2014-10-29 DIAGNOSIS — I252 Old myocardial infarction: Secondary | ICD-10-CM | POA: Insufficient documentation

## 2014-10-29 DIAGNOSIS — Z87891 Personal history of nicotine dependence: Secondary | ICD-10-CM | POA: Insufficient documentation

## 2014-10-29 DIAGNOSIS — I25119 Atherosclerotic heart disease of native coronary artery with unspecified angina pectoris: Secondary | ICD-10-CM | POA: Insufficient documentation

## 2014-10-29 DIAGNOSIS — Z79899 Other long term (current) drug therapy: Secondary | ICD-10-CM | POA: Insufficient documentation

## 2014-10-29 DIAGNOSIS — Z7982 Long term (current) use of aspirin: Secondary | ICD-10-CM | POA: Diagnosis not present

## 2014-10-29 DIAGNOSIS — Z9861 Coronary angioplasty status: Secondary | ICD-10-CM | POA: Insufficient documentation

## 2014-10-29 DIAGNOSIS — Z7902 Long term (current) use of antithrombotics/antiplatelets: Secondary | ICD-10-CM | POA: Diagnosis not present

## 2014-10-29 DIAGNOSIS — I209 Angina pectoris, unspecified: Secondary | ICD-10-CM

## 2014-10-29 HISTORY — DX: Old myocardial infarction: I25.2

## 2014-10-29 LAB — BASIC METABOLIC PANEL
ANION GAP: 6 (ref 5–15)
BUN: 12 mg/dL (ref 6–20)
CHLORIDE: 108 mmol/L (ref 101–111)
CO2: 24 mmol/L (ref 22–32)
CREATININE: 1.11 mg/dL (ref 0.61–1.24)
Calcium: 8.8 mg/dL — ABNORMAL LOW (ref 8.9–10.3)
Glucose, Bld: 101 mg/dL — ABNORMAL HIGH (ref 65–99)
POTASSIUM: 3.5 mmol/L (ref 3.5–5.1)
Sodium: 138 mmol/L (ref 135–145)

## 2014-10-29 LAB — CBC
HCT: 40.2 % (ref 40.0–52.0)
Hemoglobin: 13.9 g/dL (ref 13.0–18.0)
MCH: 29 pg (ref 26.0–34.0)
MCHC: 34.5 g/dL (ref 32.0–36.0)
MCV: 84.1 fL (ref 80.0–100.0)
Platelets: 142 10*3/uL — ABNORMAL LOW (ref 150–440)
RBC: 4.78 MIL/uL (ref 4.40–5.90)
RDW: 15.5 % — AB (ref 11.5–14.5)
WBC: 7.2 10*3/uL (ref 3.8–10.6)

## 2014-10-29 LAB — PROTIME-INR
INR: 1
PROTHROMBIN TIME: 13.4 s (ref 11.4–15.0)

## 2014-10-29 LAB — TROPONIN I

## 2014-10-29 MED ORDER — ASPIRIN 81 MG PO CHEW
324.0000 mg | CHEWABLE_TABLET | Freq: Once | ORAL | Status: AC
  Administered 2014-10-29: 324 mg via ORAL
  Filled 2014-10-29: qty 4

## 2014-10-29 MED ORDER — ISOSORBIDE MONONITRATE ER 30 MG PO TB24
30.0000 mg | ORAL_TABLET | Freq: Every day | ORAL | Status: DC
  Administered 2014-10-29: 30 mg via ORAL
  Filled 2014-10-29: qty 1

## 2014-10-29 MED ORDER — ISOSORBIDE MONONITRATE ER 30 MG PO TB24: 30.0000 mg | ORAL_TABLET | Freq: Every day | ORAL | Status: DC

## 2014-10-29 NOTE — ED Provider Notes (Signed)
[facility] Emergency Department Provider Note   ____________________________________________  Time seen: 5 PM I have reviewed the triage vital signs and the triage nursing note.  HISTORY  Chief Complaint Chest Pain   Historian Patient and wife  HPI Aaron Kelley is a 54 y.o. male who has a history of MI and stent placement in 2014, who is presenting with multiple episodes of right-sided sharp chest pain which started this morning around 5 AM and have occurred all day long. Numerous episodes without associated nausea, vomiting, or diaphoresis, or shortness of breath. The symptoms are somewhat similar to when he experienced the heart attack, however they're shorter in duration. He has had one episode of angina in 2015 for which similar symptoms occurred and he stayed overnight and his enzymes were negative and had a negative stress test. Patient had used nitroglycerin 4 times today which is highly unusual for him. He has been under significant stress at work and he also has an exertional job. He is not reporting any exertional chest pains. He recently for the past several weeks has been using Lunesta to sleep.    Past Medical History  Diagnosis Date  . Coronary artery disease 05/2012    MI tx. @ [facility] 2 stents placed   . MI, old     Patient Active Problem List   Diagnosis Date Noted  . Environmental and seasonal allergies 08/16/2014  . PVC's (premature ventricular contractions) 08/16/2014  . CAD (coronary artery disease) 02/09/2014  . Evaluate for Sleep apnea 07/04/2013  . Acute MI, inferior wall, subsequent episode of care 08/19/2012  . Hyperlipidemia with target LDL less than 70 08/19/2012  . Tobacco use 08/19/2012    Past Surgical History  Procedure Laterality Date  . Rt. acl surgery  1993    [facility]  . Appendectomy  1972    Current Outpatient Rx  Name  Route  Sig  Dispense  Refill  . aspirin 81 MG tablet   Oral   Take 81 mg by  mouth daily.         Marland Kitchen atorvastatin (LIPITOR) 20 MG tablet   Oral   Take 20 mg by mouth daily.         . clopidogrel (PLAVIX) 75 MG tablet   Oral   Take 75 mg by mouth daily.         . clorazepate (TRANXENE) 7.5 MG tablet   Oral   Take 7.5 mg by mouth 2 (two) times daily as needed for anxiety. prn         . isosorbide mononitrate (IMDUR) 30 MG 24 hr tablet   Oral   Take 1 tablet (30 mg total) by mouth daily.   14 tablet   0   . metoprolol tartrate (LOPRESSOR) 25 MG tablet   Oral   Take 12.5 mg by mouth 2 (two) times daily.         . nitroGLYCERIN (NITROSTAT) 0.4 MG SL tablet   Sublingual   Place 0.4 mg under the tongue every 5 (five) minutes as needed for chest pain.         . pantoprazole (PROTONIX) 40 MG tablet   Oral   Take 1 tablet by mouth daily.           Allergies Lisinopril  Family History  Problem Relation Age of Onset  . Heart disease Father   . Heart attack Father   . Heart disease Brother   . Heart attack Brother   .  Heart disease Paternal Grandfather     Social History Social History  Substance Use Topics  . Smoking status: Former Smoker -- 1.00 packs/day for 35 years    Types: Cigarettes  . Smokeless tobacco: Current User    Types: Chew  . Alcohol Use: No    Review of Systems  Constitutional: Negative for fever. Eyes: Negative for visual changes. ENT: Negative for sore throat. Cardiovascular: Negative for palpitations. Respiratory: Negative for shortness of breath or cough. Gastrointestinal: Negative for abdominal pain, vomiting and diarrhea. Genitourinary: Negative for dysuria. Musculoskeletal: Negative for back pain. No lower extremity swelling. Skin: Negative for rash. Neurological: Negative for headaches, focal weakness or numbness. 10 point Review of Systems otherwise negative ____________________________________________   PHYSICAL EXAM:  VITAL SIGNS: ED Triage Vitals  Enc Vitals Group     BP 10/29/14 1604  127/76 mmHg     Pulse Rate 10/29/14 1604 54     Resp 10/29/14 1604 19     Temp 10/29/14 1604 98.2 F (36.8 C)     Temp Source 10/29/14 1604 Oral     SpO2 10/29/14 1604 96 %     Weight 10/29/14 1604 210 lb (95.255 kg)     Height 10/29/14 1604 6\' 1"  (1.854 m)     Head Cir --      Peak Flow --      Pain Score --      Pain Loc --      Pain Edu? --      Excl. in [name]? --      Constitutional: Alert and oriented. Well appearing and in no distress. Eyes: Conjunctivae are normal. PERRL. Normal extraocular movements. ENT   Head: Normocephalic and atraumatic.   Nose: No congestion/rhinnorhea.   Mouth/Throat: Mucous membranes are moist.   Neck: No stridor. Cardiovascular/Chest: Bradycardia, slightly irregular.  No murmurs, rubs, or gallops. Respiratory: Normal respiratory effort without tachypnea nor retractions. Breath sounds are clear and equal bilaterally. No wheezes/rales/rhonchi. Gastrointestinal: Soft. No distention, no guarding, no rebound. Nontender   Genitourinary/rectal:Deferred Musculoskeletal: Nontender with normal range of motion in all extremities. No joint effusions.  No lower extremity tenderness nor edema. Neurologic:  Normal speech and language. No gross or focal neurologic deficits are appreciated. Skin:  Skin is warm, dry and intact. No rash noted. Psychiatric: Mood and affect are normal. Speech and behavior are normal. Patient exhibits appropriate insight and judgment.  ____________________________________________   EKG I, Lisa Roca, MD, the attending physician have personally viewed and interpreted all ECGs.  56 bpm. Sinus bradycardia. Normal axis. Narrow QRS. T wave inversions in 3 and flattening in aVF. These are similar to prior EKG findings.  Rhythm strip interpretation: Heart rate 59. Sinus bradycardia with PVCs. ____________________________________________  LABS (pertinent positives/negatives)  Basic metabolic panel without significant  abnormality CBC within normal limits except platelet count 142 Troponin less than 0.03 INR 1.0  ____________________________________________  RADIOLOGY All Xrays were viewed by me. Imaging interpreted by Radiologist.  Chest x-ray: Negative __________________________________________  PROCEDURES  Procedure(s) performed: None Critical Care performed: None  ____________________________________________   ED COURSE / ASSESSMENT AND PLAN  CONSULTATIONS: Phone consultation with [facility] cardiologist on-call for Dr. Shelva Majestic, the patient's primary cardiologist  Pertinent labs & imaging results that were available during my care of the patient were reviewed by me and considered in my medical decision making (see chart for details).   Patient's symptoms are somewhat atypical in that they're right-sided and last less than 1 minute at a time and  is sharp, and without any associated symptoms. However, the symptoms do seem somewhat similar in character to when the patient was found to have an MI and needed stents placed. However is also similar to an episode last year when he had a negative troponin and a negative stress test. The patient mostly wanted to have his enzymes checked. His symptoms started early in the morning, and has been several hours of on and off chest pains, and his EKG is unremarkable and unchanged from prior, his troponin is negative. I discussed with the patient the stuttering character was a little concerning especially given that this felt somewhat similar to prior MI and he has ongoing risk factors tobacco use, and I recommended observation overnight for further evaluation. The patient is very against staying overnight in the hospital. He also does not want to stay for a repeat troponin level in a few hours. Wife says that his son is home from college and he wants to spend time with him. The patient states that he just figures this is just "angina" and it doesn't feel  like a heart attack and so he wants to go home. He does understand the risk of missed heart attack, congestive heart failure, arrhythmia, and death, and he is capable of making his own decisions and understanding the risk and benefits.  I spoke with the on-call cardiologist for his group and they also recommended 24-hour observation, however given the patient did not want to do this, he recommended to start the patient on 30 mg Imdur long-acting and to ensure the patient knows to come back to the ER for any worsening condition. I did discuss this with the patient and his wife and they are agreeable to call 911 and come to the emergency department for any new or worsening chest pain or other condition.  Patient / Family / Caregiver informed of clinical course, medical decision-making process, and agree with plan.   I discussed return precautions, follow-up instructions, and discharged instructions with patient and/or family.  ___________________________________________   FINAL CLINICAL IMPRESSION(S) / ED DIAGNOSES   Final diagnoses:  Chest pain, unspecified chest pain type  AP (angina pectoris)    FOLLOW UP  Referred to: Patient's cardiologist, Dr. Shelva Majestic on Monday.  Lisa Roca, MD 10/29/14 [phone]

## 2014-10-29 NOTE — Discharge Instructions (Signed)
No certain cause was found for your chest pain, however it sounds like angina. Your exam and evaluation are reassuring. You chose not to stay for a repeat blood draw of the troponin enzyme. I recommended hospitalization overnight for further evaluation of your chest pain, however you chose to home. After discussing with a partner of your cardiologist, Dr. Claiborne Billings, he recommended that if you're not staying overnight in the hospital, the next best option would be to add Imdur to help with anginal chest pain. We discussed the risk of not repeating her enzyme or staying overnight, which could include missing a heart attack, congestive heart failure, arrhythmia, worsening chronic condition including shortness of breath, or death. Return immediately to the emergency department for any new or worsening chest pain, nausea, sweating, weakness, dizziness, passing out, or any other symptoms concerning to you. You need to see your cardiologist as soon as possible. Call Monday for an appointment.   Angina Pectoris Angina pectoris, often just called angina, is extreme discomfort in your chest, neck, or arm caused by a lack of blood in the middle and thickest layer of your heart wall (myocardium). It may feel like tightness or heavy pressure. It may feel like a crushing or squeezing pain. Some people say it feels like gas or indigestion. It may go down your shoulders, back, and arms. Some people may have symptoms other than pain. These symptoms include fatigue, shortness of breath, cold sweats, or nausea. There are four different types of angina:  Stable angina--Stable angina usually occurs in episodes of predictable frequency and duration. It usually is brought on by physical activity, emotional stress, or excitement. These are all times when the myocardium needs more oxygen. Stable angina usually lasts a few minutes and often is relieved by taking a medicine that can be taken under your tongue (sublingually). The medicine is  called nitroglycerin. Stable angina is caused by a buildup of plaque inside the arteries, which restricts blood flow to the heart muscle (atherosclerosis).  Unstable angina--Unstable angina can occur even when your body experiences little or no physical exertion. It can occur during sleep. It can also occur at rest. It can suddenly increase in severity or frequency. It might not be relieved by sublingual nitroglycerin. It can last up to 30 minutes. The most common cause of unstable angina is a blood clot that has developed on the top of plaque buildup inside a coronary artery. It can lead to a heart attack if the blood clot completely blocks the artery.  Microvascular angina--This type of angina is caused by a disorder of tiny blood vessels called arterioles. Microvascular angina is more common in women. The pain may be more severe and last longer than other types of angina pectoris.  Prinzmetal or variant angina--This type of angina pectoris usually occurs when your body experiences little or no physical exertion. It especially occurs in the early morning hours. It is caused by a spasm of your coronary artery. HOME CARE INSTRUCTIONS   Only take over-the-counter and prescription medicines as directed by your health care provider.  Stay active or increase your exercise as directed by your health care provider.  Limit strenuous activity as directed by your health care provider.  Limit heavy lifting as directed by your health care provider.  Maintain a healthy weight.  Learn about and eat heart-healthy foods.  Do not use any tobacco products including cigarettes, chewing tobacco or electronic cigarettes. SEEK IMMEDIATE MEDICAL CARE IF:  You experience the following symptoms:  Chest,  neck, deep shoulder, or arm pain or discomfort that lasts more than a few minutes.  Chest, neck, deep shoulder, or arm pain or discomfort that goes away and comes back, repeatedly.  Heavy sweating with  discomfort, without a noticeable cause.  Shortness of breath or difficulty breathing.  Angina that does not get better after a few minutes of rest or after taking sublingual nitroglycerin. These can all be symptoms of a heart attack, which is a medical emergency! Get medical help at once. Call your local emergency service (911 in [place] immediately. Do not  drive yourself to the hospital and do not  wait to for your symptoms to go away. MAKE SURE YOU:  Understand these instructions.  Will watch your condition.  Will get help right away if you are not doing well or get worse. Document Released: 03/04/2005 Document Revised: 03/09/2013 Document Reviewed: 07/06/2013 [facility] Patient Information 2015 [place], [place]. This information is not intended to replace advice given to you by your health care provider. Make sure you discuss any questions you have with your health care provider.  Chest Pain (Nonspecific) It is often hard to give a diagnosis for the cause of chest pain. There is always a chance that your pain could be related to something serious, such as a heart attack or a blood clot in the lungs. You need to follow up with your doctor. HOME CARE  If antibiotic medicine was given, take it as directed by your doctor. Finish the medicine even if you start to feel better.  For the next few days, avoid activities that bring on chest pain. Continue physical activities as told by your doctor.  Do not use any tobacco products. This includes cigarettes, chewing tobacco, and e-cigarettes.  Avoid drinking alcohol.  Only take medicine as told by your doctor.  Follow your doctor's suggestions for more testing if your chest pain does not go away.  Keep all doctor visits you made. GET HELP IF:  Your chest pain does not go away, even after treatment.  You have a rash with blisters on your chest.  You have a fever. GET HELP RIGHT AWAY IF:   You have more pain or pain that spreads to your  arm, neck, jaw, back, or belly (abdomen).  You have shortness of breath.  You cough more than usual or cough up blood.  You have very bad back or belly pain.  You feel sick to your stomach (nauseous) or throw up (vomit).  You have very bad weakness.  You pass out (faint).  You have chills. This is an emergency. Do not wait to see if the problems will go away. Call your local emergency services (911 in [place] Do not drive yourself to the hospital. MAKE SURE YOU:   Understand these instructions.  Will watch your condition.  Will get help right away if you are not doing well or get worse. Document Released: 08/21/2007 Document Revised: 03/09/2013 Document Reviewed: 08/21/2007 [facility] Patient Information 2015 [place], [place]. This information is not intended to replace advice given to you by your health care provider. Make sure you discuss any questions you have with your health care provider.

## 2014-10-29 NOTE — ED Notes (Signed)
Pt reports right sided chest pain, denies radiation since 5am this morning. Pt with hx of MI, has taken 4 nitros today. Pt denies SHOB, N/V, diaphoresis or dizziness.

## 2014-12-12 ENCOUNTER — Ambulatory Visit (INDEPENDENT_AMBULATORY_CARE_PROVIDER_SITE_OTHER): Payer: 59 | Admitting: Cardiovascular Disease

## 2014-12-12 VITALS — BP 110/72 | HR 67 | Ht 73.5 in | Wt 202.6 lb

## 2014-12-12 DIAGNOSIS — R0789 Other chest pain: Secondary | ICD-10-CM | POA: Diagnosis not present

## 2014-12-12 DIAGNOSIS — E785 Hyperlipidemia, unspecified: Secondary | ICD-10-CM

## 2014-12-12 DIAGNOSIS — I25810 Atherosclerosis of coronary artery bypass graft(s) without angina pectoris: Secondary | ICD-10-CM

## 2014-12-12 DIAGNOSIS — I252 Old myocardial infarction: Secondary | ICD-10-CM | POA: Diagnosis not present

## 2014-12-12 NOTE — Patient Instructions (Signed)
Your physician wants you to follow-up in: 6 months or sooner if needed. You will receive a reminder letter in the mail two months in advance. If you don't receive a letter, please call our office to schedule the follow-up appointment. 

## 2014-12-14 ENCOUNTER — Encounter: Payer: Self-pay | Admitting: Cardiovascular Disease

## 2014-12-14 DIAGNOSIS — R0789 Other chest pain: Secondary | ICD-10-CM | POA: Insufficient documentation

## 2014-12-14 DIAGNOSIS — I252 Old myocardial infarction: Secondary | ICD-10-CM | POA: Insufficient documentation

## 2014-12-14 NOTE — Progress Notes (Signed)
Patient ID: Aaron Kelley, male   DOB: Jul 27, 1960, 54 y.o.   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  Mr. Aaron Kelley developed recurrent episodes of chest discomfort associated with significant shortness of breath while at work on 06/03/2012.  He presented to [facility] and initial catheterization was done acutely by [name].  An attempted intervention to his subtotally occluded RCA was unsuccessful and the patient was  transported to [facility] and subsequently underwent successful intervention with placement of two 3.0x24 mm Promus premier drug-eluting stents. He was discharged on aspirin, atorvastatin 80 mg, and Lopressor but he develop significant issues with  fatigability. He saw the physician assistant on several occasions at [facility] and ultimately he was taken off his beta blocker therapy. An echo Doppler study on Jul 23, 2012  showed an ejection fraction of approximately 50% with inferior hypocontractility, as well as mild concentric LVH and grade 1 diastolic dysfunction. Several days later on Jul 28, 2012 due to  recurrent chest discomfort and T-wave changes he underwent repeat cardiac catheterization at [facility]. This showed patent stents in the proximal RCA. He did have a long tubular 30% stenosis in the mid LAD. Other vessels were normal. The patient had subsequently seen Dr. [name] and was referred to me for subsequent post-MI management and care.  When I initially saw him on August 19, 2012 I  recommended an initial trial of ACE inhibitor and started him on low-dose lisinopril. He could not tolerate this small dose and experienced some recurrent chest pain. An NMR  lipoprofile  revealed a calculated LDL of 41 with an LDL particle number of 711. HDL cholesterol was low at 34 and his HDL particle number remains low at 24.9. Triglycerides are excellent at 80.  Insulin resistance was slightly elevated at 52. Hemoglobin was 14.1 hematocrit 41.1 BUN 12 a 1.12. Fasting glucose mildly elevated at 123. I also performed a  P2Y12 assessment of platelet function since he was on Plavix and this revealed adequate platelet response with a value of 191. Vitamin D. level was normal at 50. TSH was 2.602.  A nuclear perfusion  study on 05/18/2013 revealed good exercise capacity without chest pain or ECG changes was felt to be low risk with a small area of fixed inferobasal defect, suggesting of a small scar versus diaphragmatic attenuation.  There was no ischemia.  Bood work from 05/10/2013 done by Dr. [name]: Total cholesterol was 115, LDL cholesterol 54, HDL cholesterol, mildly low at 37, and triglycerides of 118.  When I saw him I was concerned about obstructive sleep apnea. His wife commented that he was experiencing very loud snoring.  He admitted that his sleep wa unbearable.  I strongly recommended a sleep evaluation but he has since still not pursued this.  He  developed an episode of chest pain while at work after he was extremely fatigued following a stomach virus which had resulted in significant diarrhea and vomiting leading to dehydration.  He was evaluated at [facility] where he was admitted overnight.  Enzymes were negative.  He subsequent underwent another nuclear perfusion study on 12/29/2013.  He exercised for 8 minutes and 30 seconds and achieved a 10 minute workload and peak heart rate of 1 48 bpm.  Scintigraphic images again showed a small to medium-sized mild to moderate intensity fixed perfusion defect  in the basal to mid inferior wall consistent with his known prior inferior MI.  As I last saw him, he was evaluated in the emergency room with sharp different chest pain that he expands previously.  Troponin was negative.  He was given a dose of isosorbide 30 mg but has not been on this regularly.  He denies recurrent chest  pain of any similar characteristic to his prior heart attack.  He has been working outside over the summer and heat has led to significant fatigue.  He is unaware of palpitations.  He presents for evaluation.   Past Medical History  Diagnosis Date  . Coronary artery disease 05/2012    MI tx. @ [facility] 2 stents placed   . MI, old     Past Surgical History  Procedure Laterality Date  . Rt. acl surgery  1993    [facility]  . Appendectomy  1972    Allergies  Allergen Reactions  . Lisinopril Other (See Comments)    Severe chest pain and fatigue    Current Outpatient Prescriptions  Medication Sig Dispense Refill  . aspirin 81 MG tablet Take 81 mg by mouth daily.    Marland Kitchen atorvastatin (LIPITOR) 20 MG tablet Take 20 mg by mouth daily.    . clopidogrel (PLAVIX) 75 MG tablet Take 75 mg by mouth daily.    . clorazepate (TRANXENE) 7.5 MG tablet Take 7.5 mg by mouth 2 (two) times daily as needed for anxiety. prn    . metoprolol tartrate (LOPRESSOR) 25 MG tablet Take 12.5 mg by mouth 2 (two) times daily.    . nitroGLYCERIN (NITROSTAT) 0.4 MG SL tablet Place 0.4 mg under the tongue every 5 (five) minutes as needed for chest pain.    . pantoprazole (PROTONIX) 40 MG tablet Take 1 tablet by mouth daily.     No current facility-administered medications for this visit.    Socially he is married. He works as a Oceanographer. He had smoked for 40 years but quit the day of his heart attack. Mostly, he did have a history of alcohol and drug use but he quit approximately 14 years ago.  Family History  Problem Relation Age of Onset  . Heart disease Father   . Heart attack Father   . Heart disease Brother   . Heart attack Brother   . Heart disease Paternal Grandfather     ROS General: Negative; No fevers, chills, or night sweats;  HEENT: Negative; No changes in vision or hearing, sinus congestion, difficulty swallowing Pulmonary: Negative; No cough, wheezing, shortness of breath,  hemoptysis Cardiovascular: see HPI GI: Negative; No nausea, vomiting, diarrhea, or abdominal pain GU: Negative; No dysuria, hematuria, or difficulty voiding Musculoskeletal: Negative; no myalgias, joint pain, or weakness Hematologic/Oncology: Negative; no easy bruising, bleeding Endocrine: Negative; no heat/cold intolerance; no diabetes Neuro: Negative; no changes in balance, headaches Skin: Negative; No rashes or skin lesions Psychiatric: Negative; No behavioral problems, depression Sleep: Very poor sleep with loud snoring, no daytime sleepiness, hypersomnolence, bruxism, restless legs, hypnogognic hallucinations, no cataplexy Other comprehensive 14 point system review is negative.   PE BP 110/72 mmHg  Pulse 67  Ht 6' 1.5" (1.867 m)  Wt 202 lb 9.6 oz (91.899 kg)  BMI 26.36 kg/m2   Wt Readings from Last 3 Encounters:  12/12/14 202 lb 9.6 oz (91.899 kg)  10/29/14 210 lb (95.255 kg)  08/16/14 215 lb 1.6 oz (97.569 kg)   General: Alert, oriented, no distress.  HEENT: Normocephalic, atraumatic. Pupils round  and reactive; sclera anicteric; Fundi no hemorrhages or exudates. Nose without nasal septal hypertrophy Mouth/Parynx benign; Mallinpatti scale 3 Neck: No JVD, no carotid bruits with normal carotid upstroke. Lungs: Decreased breath sounds diffusely; no wheezing or rales Chest wall: Nontender to palpation Heart: RRR, s1 s2 normal 1/6 systolic murmur; diastolic murmur.  No rubs thrills or heaves. Abdomen: soft, nontender; no hepatosplenomehaly, BS+; abdominal aorta nontender and not dilated by palpation. Pulses 2+ catheterization sites well-healed. Back: No CVA tenderness Extremities: no clubbinbg cyanosis or edema, Homan's sign negative  Neurologic: grossly nonfocal Psychological: Normal affect and mood   ECG (independently read by me): Normal sinus rhythm at 67 bpm.  Q waves inferiorly, compatible with his old inferior MI.  No significant ST changes.  May 2016 ECG  (independently read by me): Normal sinus rhythm at 73 bpm.  Occasional unifocal PVCs.  November 2015 ECG (independently read by me): Sinus rhythm with PVC.  Old inferior by with Q waves in leads 3 and aVF.  Mild RV conduction delay.  Prior 11/20/2012 ECG: Sinus bradycardia at 57 beats per minute. Evidence for old inferior infarct with Q wave in 3.  T-wave inversion in 3 and F and also T-wave inversion V1 through V4.   BMP Latest Ref Rng 10/29/2014 12/28/2013 08/20/2012  Glucose 65 - 99 mg/dL 101(H) 95 123(H)  BUN 6 - 20 mg/dL '12 13 12  ' Creatinine 0.61 - 1.24 mg/dL 1.11 1.23 1.12  Sodium 135 - 145 mmol/L 138 139 139  Potassium 3.5 - 5.1 mmol/L 3.5 3.5 3.7  Chloride 101 - 111 mmol/L 108 109(H) 108  CO2 22 - 32 mmol/L '24 24 23  ' Calcium 8.9 - 10.3 mg/dL 8.8(L) 8.7 9.3   Hepatic Function Latest Ref Rng 08/20/2012 06/03/2012  Total Protein 6.0 - 8.3 g/dL 6.7 7.3  Albumin 3.5 - 5.2 g/dL 3.9 3.9  AST 0 - 37 U/L 21 22  ALT 0 - 53 U/L 23 35  Alk Phosphatase 39 - 117 U/L 61 84  Total Bilirubin 0.3 - 1.2 mg/dL 0.6 0.4   CBC Latest Ref Rng 10/29/2014 12/28/2013 08/20/2012  WBC 3.8 - 10.6 K/uL 7.2 8.3 5.0  Hemoglobin 13.0 - 18.0 g/dL 13.9 14.3 14.1  Hematocrit 40.0 - 52.0 % 40.2 42.7 41.1  Platelets 150 - 440 K/uL 142(L) 160 152   Lab Results  Component Value Date   TSH 2.602 08/20/2012  No results found for: HGBA1C  Lipid Panel     Component Value Date/Time   CHOL 100 12/29/2013 0450   TRIG 124 12/29/2013 0450   HDL 27* 12/29/2013 0450   VLDL 25 12/29/2013 0450   LDLCALC 48 12/29/2013 0450    RADIOLOGY: No results found.   ASSESSMENT AND PLAN:   Mr. Edenfield is a 54 year old white male who suffered an inferior wall myocardial infarction on 06/03/2012. Primary intervention was difficult but ultimately successful at [facility] following a failed attempt at [facility] with insertion of 2 DES Premier  stents into his proximal RCA.  He has mild concomitant coronary artery disease  with smooth tubular narrowing of his middle LAD of 30%.  He did not tolerate even a very low dose of  ACE inhibition and this may have been due to hypotension particularly in the setting of extreme heat and humidity while working.  He continues to be on dual antiplatelet therapy with aspirin and Plavix.  His last nuclear perfusion study done at [facility] was unchanged and again showed a small to medium size  mild to moderate intensity fixed perfusion defect in the basal to midinferior wall consistent with his known prior inferior MI without associated ischemia.  He recently was evaluated with sharp chest pain different from previously.  He actually feels better with activity.  He did receive a dose of isosorbide but has not been on any chronically.  He continues to feel well.  Now that the extreme heat has subsided he is able to be more efficient at work.  I reviewed his blood work from June.  At that time his cholesterol was 102, triglycerides 104, HDL 32, and LDL 49, on therapy.  As long as he remains stable I'll see him in 6 months for reevaluation.  Time spent: 25 minutes  Troy Sine, MD, [facility] 12/14/2014 2:13 PM

## 2014-12-30 ENCOUNTER — Encounter: Payer: Self-pay | Admitting: Cardiovascular Disease

## 2015-03-06 ENCOUNTER — Encounter: Payer: Self-pay | Admitting: Emergency Medicine

## 2015-03-06 ENCOUNTER — Emergency Department
Admission: EM | Admit: 2015-03-06 | Discharge: 2015-03-06 | Disposition: A | Discharge status: Home / Self Care | Payer: 59 | Attending: Emergency Medicine | Admitting: Emergency Medicine

## 2015-03-06 DIAGNOSIS — Z87891 Personal history of nicotine dependence: Secondary | ICD-10-CM | POA: Insufficient documentation

## 2015-03-06 DIAGNOSIS — Z79899 Other long term (current) drug therapy: Secondary | ICD-10-CM | POA: Diagnosis not present

## 2015-03-06 DIAGNOSIS — Y9389 Activity, other specified: Secondary | ICD-10-CM | POA: Diagnosis not present

## 2015-03-06 DIAGNOSIS — Z792 Long term (current) use of antibiotics: Secondary | ICD-10-CM | POA: Insufficient documentation

## 2015-03-06 DIAGNOSIS — Y92410 Unspecified street and highway as the place of occurrence of the external cause: Secondary | ICD-10-CM | POA: Insufficient documentation

## 2015-03-06 DIAGNOSIS — S29012A Strain of muscle and tendon of back wall of thorax, initial encounter: Secondary | ICD-10-CM | POA: Insufficient documentation

## 2015-03-06 DIAGNOSIS — Y998 Other external cause status: Secondary | ICD-10-CM | POA: Diagnosis not present

## 2015-03-06 DIAGNOSIS — Z7982 Long term (current) use of aspirin: Secondary | ICD-10-CM | POA: Insufficient documentation

## 2015-03-06 DIAGNOSIS — S299XXA Unspecified injury of thorax, initial encounter: Secondary | ICD-10-CM | POA: Diagnosis present

## 2015-03-06 HISTORY — DX: Pure hypercholesterolemia, unspecified: E78.00

## 2015-03-06 HISTORY — DX: Gastro-esophageal reflux disease without esophagitis: K21.9

## 2015-03-06 MED ORDER — DIAZEPAM 2 MG PO TABS: 2.0000 mg | ORAL_TABLET | Freq: Three times a day (TID) | ORAL | Status: DC | PRN
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
## 2015-03-08 ENCOUNTER — Telehealth: Payer: Self-pay | Admitting: Cardiovascular Disease

## 2015-03-08 NOTE — Telephone Encounter (Signed)
Pt is going to have his tooth extracted on 03-14-15. When does he need to stop his Plavix?

## 2015-03-08 NOTE — Telephone Encounter (Signed)
Pt recently given advice by his dentist to hold Plavix 3 days prior to an upcoming extraction taking place on 27th.  Had MI 3 years ago, PCI w 2 DES. Initiated on DAPT at that time.  Pt given advice that Plavix could be continued if single tooth extraction - pt would prefer to stop if he is able since he "bleeds pretty easily". Will route to Dr. Claiborne Billings for recommendation - OK to stop Plavix, ASA?

## 2015-03-16 NOTE — Telephone Encounter (Signed)
Ok to hold ASA/Plavix if necessary can hold for 5 days

## 2015-03-18 ENCOUNTER — Emergency Department
Admission: EM | Admit: 2015-03-18 | Discharge: 2015-03-18 | Disposition: A | Discharge status: Home / Self Care | Payer: 59 | Attending: Emergency Medicine | Admitting: Emergency Medicine

## 2015-03-18 ENCOUNTER — Emergency Department: Payer: 59

## 2015-03-18 DIAGNOSIS — Z87891 Personal history of nicotine dependence: Secondary | ICD-10-CM | POA: Insufficient documentation

## 2015-03-18 DIAGNOSIS — Z792 Long term (current) use of antibiotics: Secondary | ICD-10-CM | POA: Insufficient documentation

## 2015-03-18 DIAGNOSIS — Z7902 Long term (current) use of antithrombotics/antiplatelets: Secondary | ICD-10-CM | POA: Insufficient documentation

## 2015-03-18 DIAGNOSIS — Z79899 Other long term (current) drug therapy: Secondary | ICD-10-CM | POA: Insufficient documentation

## 2015-03-18 DIAGNOSIS — F419 Anxiety disorder, unspecified: Secondary | ICD-10-CM | POA: Diagnosis not present

## 2015-03-18 DIAGNOSIS — Z7982 Long term (current) use of aspirin: Secondary | ICD-10-CM | POA: Insufficient documentation

## 2015-03-18 DIAGNOSIS — R1030 Lower abdominal pain, unspecified: Secondary | ICD-10-CM

## 2015-03-18 DIAGNOSIS — M79652 Pain in left thigh: Secondary | ICD-10-CM | POA: Insufficient documentation

## 2015-03-18 DIAGNOSIS — K5732 Diverticulitis of large intestine without perforation or abscess without bleeding: Secondary | ICD-10-CM | POA: Insufficient documentation

## 2015-03-18 DIAGNOSIS — R1033 Periumbilical pain: Secondary | ICD-10-CM | POA: Diagnosis present

## 2015-03-18 LAB — URINALYSIS COMPLETE WITH MICROSCOPIC (ARMC ONLY)
BACTERIA UA: NONE SEEN
Bilirubin Urine: NEGATIVE
Glucose, UA: NEGATIVE mg/dL
HGB URINE DIPSTICK: NEGATIVE
KETONES UR: NEGATIVE mg/dL
LEUKOCYTES UA: NEGATIVE
NITRITE: NEGATIVE
PROTEIN: NEGATIVE mg/dL
SPECIFIC GRAVITY, URINE: 1.03 (ref 1.005–1.030)
pH: 5 (ref 5.0–8.0)

## 2015-03-18 LAB — COMPREHENSIVE METABOLIC PANEL
ALK PHOS: 57 U/L (ref 38–126)
ALT: 34 U/L (ref 17–63)
ANION GAP: 6 (ref 5–15)
AST: 19 U/L (ref 15–41)
Albumin: 4.1 g/dL (ref 3.5–5.0)
BUN: 13 mg/dL (ref 6–20)
CALCIUM: 9.1 mg/dL (ref 8.9–10.3)
CO2: 26 mmol/L (ref 22–32)
CREATININE: 1.13 mg/dL (ref 0.61–1.24)
Chloride: 106 mmol/L (ref 101–111)
Glucose, Bld: 87 mg/dL (ref 65–99)
Potassium: 3.7 mmol/L (ref 3.5–5.1)
SODIUM: 138 mmol/L (ref 135–145)
TOTAL PROTEIN: 6.8 g/dL (ref 6.5–8.1)
Total Bilirubin: 1 mg/dL (ref 0.3–1.2)

## 2015-03-18 LAB — CBC
HCT: 39.1 % — ABNORMAL LOW (ref 40.0–52.0)
HEMOGLOBIN: 13.3 g/dL (ref 13.0–18.0)
MCH: 28.4 pg (ref 26.0–34.0)
MCHC: 34 g/dL (ref 32.0–36.0)
MCV: 83.3 fL (ref 80.0–100.0)
Platelets: 136 10*3/uL — ABNORMAL LOW (ref 150–440)
RBC: 4.69 MIL/uL (ref 4.40–5.90)
RDW: 15 % — ABNORMAL HIGH (ref 11.5–14.5)
WBC: 14 10*3/uL — AB (ref 3.8–10.6)

## 2015-03-18 LAB — LIPASE, BLOOD: Lipase: 18 U/L (ref 11–51)

## 2015-03-18 LAB — TROPONIN I

## 2015-03-18 MED ORDER — CIPROFLOXACIN HCL 500 MG PO TABS
500.0000 mg | ORAL_TABLET | Freq: Once | ORAL | Status: AC
  Administered 2015-03-18: 500 mg via ORAL
  Filled 2015-03-18: qty 1

## 2015-03-18 MED ORDER — METRONIDAZOLE 500 MG PO TABS: 500.0000 mg | ORAL_TABLET | Freq: Two times a day (BID) | ORAL | Status: DC

## 2015-03-18 MED ORDER — ONDANSETRON HCL 4 MG PO TABS: 4.0000 mg | ORAL_TABLET | Freq: Three times a day (TID) | ORAL | Status: DC | PRN

## 2015-03-18 MED ORDER — HYDROCODONE-ACETAMINOPHEN 5-325 MG PO TABS
ORAL_TABLET | ORAL | Status: AC
  Administered 2015-03-18: 1 via ORAL
  Filled 2015-03-18: qty 1

## 2015-03-18 MED ORDER — SODIUM CHLORIDE 0.9 % IV BOLUS (SEPSIS)
1000.0000 mL | Freq: Once | INTRAVENOUS | Status: AC
  Administered 2015-03-18: 1000 mL via INTRAVENOUS

## 2015-03-18 MED ORDER — HYDROCODONE-ACETAMINOPHEN 5-325 MG PO TABS: 1.0000 | ORAL_TABLET | Freq: Four times a day (QID) | ORAL | Status: DC | PRN

## 2015-03-18 MED ORDER — CIPROFLOXACIN HCL 500 MG PO TABS: 500.0000 mg | ORAL_TABLET | Freq: Two times a day (BID) | ORAL | Status: DC

## 2015-03-18 MED ORDER — IOHEXOL 350 MG/ML SOLN
100.0000 mL | Freq: Once | INTRAVENOUS | Status: AC | PRN
  Administered 2015-03-18: 100 mL via INTRAVENOUS

## 2015-03-18 MED ORDER — METRONIDAZOLE 500 MG PO TABS
500.0000 mg | ORAL_TABLET | Freq: Once | ORAL | Status: AC
  Administered 2015-03-18: 500 mg via ORAL
  Filled 2015-03-18: qty 1

## 2015-03-18 MED ORDER — HYDROCODONE-ACETAMINOPHEN 5-325 MG PO TABS
1.0000 | ORAL_TABLET | Freq: Once | ORAL | Status: AC
  Administered 2015-03-18: 1 via ORAL

## 2015-03-18 MED ORDER — ONDANSETRON HCL 4 MG/2ML IJ SOLN
INTRAMUSCULAR | Status: AC
  Administered 2015-03-18: 4 mg via INTRAVENOUS
  Filled 2015-03-18: qty 2

## 2015-03-18 MED ORDER — ONDANSETRON HCL 4 MG/2ML IJ SOLN
4.0000 mg | Freq: Once | INTRAMUSCULAR | Status: AC
  Administered 2015-03-18: 4 mg via INTRAVENOUS

## 2015-03-18 MED ORDER — MORPHINE SULFATE (PF) 2 MG/ML IV SOLN
2.0000 mg | Freq: Once | INTRAVENOUS | Status: AC
  Administered 2015-03-18: 2 mg via INTRAVENOUS

## 2015-03-18 MED ORDER — MORPHINE SULFATE (PF) 2 MG/ML IV SOLN
INTRAVENOUS | Status: AC
  Administered 2015-03-18: 2 mg via INTRAVENOUS
  Filled 2015-03-18: qty 1

## 2015-03-18 MED ORDER — SODIUM CHLORIDE 0.9 % IV BOLUS (SEPSIS)
1000.0000 mL | Freq: Once | INTRAVENOUS | Status: DC
Start: 2015-03-18 — End: 2015-03-18

## 2015-03-18 NOTE — Discharge Instructions (Signed)
Return to the emergency room for any worsening condition including fever, worsening pain, nausea and vomiting, black or bloody stools, or any other symptoms concerning to you.   Diverticulitis Diverticulitis is when small pockets that have formed in your colon (large intestine) become infected or swollen. HOME CARE  Follow your doctor's instructions.  Follow a special diet if told by your doctor.  When you feel better, your doctor may tell you to change your diet. You may be told to eat a lot of fiber. Fruits and vegetables are good sources of fiber. Fiber makes it easier to poop (have bowel movements).  Take supplements or probiotics as told by your doctor.  Only take medicines as told by your doctor.  Keep all follow-up visits with your doctor. GET HELP IF:  Your pain does not get better.  You have a hard time eating food.  You are not pooping like normal. GET HELP RIGHT AWAY IF:  Your pain gets worse.  Your problems do not get better.  Your problems suddenly get worse.  You have a fever.  You keep throwing up (vomiting).  You have bloody or black, tarry poop (stool). MAKE SURE YOU:   Understand these instructions.  Will watch your condition.  Will get help right away if you are not doing well or get worse.   This information is not intended to replace advice given to you by your health care provider. Make sure you discuss any questions you have with your health care provider.   Document Released: 08/21/2007 Document Revised: 03/09/2013 Document Reviewed: 01/27/2013 Elsevier Interactive Patient Education [employer].

## 2015-03-18 NOTE — ED Notes (Signed)
At present pt denies CP. States he "had some"  Pt here for lower abdominal pain with intermittent fever.  Recently (Tuesday) had "infected" tooth extracted and is concerned about infection.  Pt reports fever of 103 F at home.

## 2015-03-18 NOTE — ED Provider Notes (Signed)
[facility] Emergency Department Provider Note   ____________________________________________  Time seen: Approximately 3 PM I have reviewed the triage vital signs and the triage nursing note.  HISTORY  Chief Complaint Abdominal Pain; Chest Pain; and Dental Pain   Historian Patient and spouse  HPI Aaron Kelley is a 54 y.o. male who started experiencing lower abdominal pain periumbilical and suprapubic yesterday, and has been waxing and waning since then. He did feel overall generalized weakness and was found have a fever of 103 yesterday. No fever today. He had a left lower molar extracted at the dentist on Tuesday and had completed antibiotics. He does not report any new or worsening swelling or pain at that area. He has had a heart attack and stents couple years ago, and is on Plavix for this. Reports some chest pressure yesterday, but no chest pain or pressure or shortness of breath today. No recent cough or congestion. This morning he also developed pain into the left thigh which he thinks is associated with the lower abdominal pain. Symptoms are moderate and no alleviating or exacerbating factors.   Past Medical History  Diagnosis Date  . Coronary artery disease 05/2012    MI tx. @ [facility] 2 stents placed   . MI, old   . Hypercholesteremia   . GERD (gastroesophageal reflux disease)     Patient Active Problem List   Diagnosis Date Noted  . Atypical chest pain 12/14/2014  . Old MI (myocardial infarction) 12/14/2014  . Environmental and seasonal allergies 08/16/2014  . PVC's (premature ventricular contractions) 08/16/2014  . CAD (coronary artery disease) 02/09/2014  . Evaluate for Sleep apnea 07/04/2013  . Acute MI, inferior wall, subsequent episode of care ([place]) 08/19/2012  . Hyperlipidemia with target LDL less than 70 08/19/2012  . Tobacco use 08/19/2012    Past Surgical History  Procedure Laterality Date  . Rt. acl surgery  1993    [facility]  . Appendectomy  1972  . Cardiac surgery      Current Outpatient Rx  Name  Route  Sig  Dispense  Refill  . amoxicillin (AMOXIL) 500 MG capsule   Oral   Take 500 mg by mouth 3 (three) times daily.         Marland Kitchen aspirin 81 MG tablet   Oral   Take 81 mg by mouth daily.         Marland Kitchen atorvastatin (LIPITOR) 20 MG tablet   Oral   Take 20 mg by mouth daily.         . ciprofloxacin (CIPRO) 500 MG tablet   Oral   Take 1 tablet (500 mg total) by mouth 2 (two) times daily.   28 tablet   0   . clopidogrel (PLAVIX) 75 MG tablet   Oral   Take 75 mg by mouth daily.         . clorazepate (TRANXENE) 7.5 MG tablet   Oral   Take 7.5 mg by mouth 2 (two) times daily as needed for anxiety. prn         . diazepam (VALIUM) 2 MG tablet   Oral   Take 1 tablet (2 mg total) by mouth every 8 (eight) hours as needed for anxiety.   15 tablet   0   . HYDROcodone-acetaminophen (NORCO/VICODIN) 5-325 MG tablet   Oral   Take 1 tablet by mouth every 6 (six) hours as needed for moderate pain.   10 tablet   0   .  metoprolol tartrate (LOPRESSOR) 25 MG tablet   Oral   Take 12.5 mg by mouth 2 (two) times daily.         . metroNIDAZOLE (FLAGYL) 500 MG tablet   Oral   Take 1 tablet (500 mg total) by mouth 2 (two) times daily.   28 tablet   0   . nitroGLYCERIN (NITROSTAT) 0.4 MG SL tablet   Sublingual   Place 0.4 mg under the tongue every 5 (five) minutes as needed for chest pain.         Marland Kitchen ondansetron (ZOFRAN) 4 MG tablet   Oral   Take 1 tablet (4 mg total) by mouth every 8 (eight) hours as needed for nausea or vomiting.   10 tablet   0   . oxycodone-acetaminophen (PERCOCET) 2.5-325 MG tablet   Oral   Take 1 tablet by mouth every 4 (four) hours as needed for pain.         . pantoprazole (PROTONIX) 40 MG tablet   Oral   Take 1 tablet by mouth daily.           Allergies Lisinopril  Family History  Problem Relation Age of Onset  . Heart disease Father   . Heart  attack Father   . Heart disease Brother   . Heart attack Brother   . Heart disease Paternal Grandfather     Social History Social History  Substance Use Topics  . Smoking status: Former Smoker -- 1.00 packs/day for 35 years    Types: Cigarettes  . Smokeless tobacco: Current User    Types: Chew  . Alcohol Use: No    Review of Systems  Constitutional: Positive for fever. Eyes: Negative for visual changes. ENT: Negative for sore throat. Cardiovascular: Positive for chest pressure yesterday Respiratory: Negative for shortness of breath. Gastrointestinal: Negative for vomiting and diarrhea. Genitourinary: Negative for dysuria. Musculoskeletal: Negative for back pain. Skin: Negative for rash. Neurological: Negative for headache. 10 point Review of Systems otherwise negative ____________________________________________   PHYSICAL EXAM:  VITAL SIGNS: ED Triage Vitals  Enc Vitals Group     BP 03/18/15 1226 133/70 mmHg     Pulse Rate 03/18/15 1226 63     Resp 03/18/15 1226 18     Temp 03/18/15 1226 98.2 F (36.8 C)     Temp Source 03/18/15 1226 Oral     SpO2 03/18/15 1226 100 %     Weight 03/18/15 1226 208 lb (94.348 kg)     Height 03/18/15 1226 6\' 1"  (1.854 m)     Head Cir --      Peak Flow --      Pain Score 03/18/15 1227 0     Pain Loc --      Pain Edu? --      Excl. in [place]? --      Constitutional: Alert and oriented. Somewhat anxious. Well appearing and in no distress. Eyes: Conjunctivae are normal. PERRL. Normal extraocular movements. ENT   Head: Normocephalic and atraumatic.   Nose: No congestion/rhinnorhea.   Mouth/Throat: Mucous membranes are moist.   Neck: No stridor. Cardiovascular/Chest: Normal rate, regular rhythm.  No murmurs, rubs, or gallops. Respiratory: Normal respiratory effort without tachypnea nor retractions. Breath sounds are clear and equal bilaterally. No wheezes/rales/rhonchi. Gastrointestinal: Soft. No distention, no guarding,  no rebound. Moderate tenderness lower abdomen and left lower quadrant  Genitourinary/rectal:Deferred Musculoskeletal: Nontender with normal range of motion in all extremities. No joint effusions.  No lower extremity tenderness.  No edema.  Neurologic:  Normal speech and language. No gross or focal neurologic deficits are appreciated. Skin:  Skin is warm, dry and intact. No rash noted. Psychiatric: Mood and affect are normal. Speech and behavior are normal. Patient exhibits appropriate insight and judgment.  ____________________________________________   EKG I, Lisa Roca, MD, the attending physician have personally viewed and interpreted all ECGs.  61 bpm. Normal sinus rhythm. Normal axis. Nonspecific intraventricular conduction delay. Nonspecific T-wave ____________________________________________  LABS (pertinent positives/negatives)  Lipase 18 Comprehensive metabolic panel within normal limits White blood cell count 14.0, hemoglobin 13.3 and platelet count 136 Troponin less than 0.03 Urinalysis negative ____________________________________________  RADIOLOGY All Xrays were viewed by me. Imaging interpreted by Radiologist.  CT abdomen and pelvis with contrast angiography:  IMPRESSION: 1. Atherosclerosis, including within the coronary arteries. No evidence of aortic dissection or aneurysm. 2. Non complicated sigmoid diverticulitis. 3. Small to moderate hiatal hernia. Wall thickening within the distal esophagus and proximal stomach is eccentric. Cannot exclude gastritis/esophagitis or neoplasia. Consider nonemergent endoscopy. 4. tiny right lung base nodule. If the patient is at high risk for bronchogenic carcinoma, follow-up chest CT at 1 year is recommended. If the patient is at low risk, no follow-up is needed. This recommendation follows the consensus statement: "Guidelines for Management of Small Pulmonary Nodules Detected on CT Scans: A Statement from the [place]" as published in Radiology 2005; 237:395-400. Available online at: https://www.arnold.com/. 5. Cholelithiasis. __________________________________________  PROCEDURES  Procedure(s) performed: None  Critical Care performed: None  ____________________________________________   ED COURSE / ASSESSMENT AND PLAN  CONSULTATIONS: None  Pertinent labs & imaging results that were available during my care of the patient were reviewed by me and considered in my medical decision making (see chart for details).   With abdominal pain and elevated white blood cell count, I did recommend CT scan for further intra-abdominal abdominal evaluation. Given he is having some leg symptoms, I did obtain CT angios to also evaluate the aorta.  CT scan shows sigmoid diverticulitis and no acute/emergent vascular findings.  I did discuss the incidental finding of atherosclerosis as well as the right lower lobe nodule and recommended follow-up chest CT a 1 year to be discussed with his private care physician.  Dr. colitis treated with Cipro and Flagyl, and patient will be discharged with these aromatics plus Norco for a few days when necessary supply, and Zofran.  Patient / Family / Caregiver informed of clinical course, medical decision-making process, and agree with plan.   I discussed return precautions, follow-up instructions, and discharged instructions with patient and/or family.  ___________________________________________   FINAL CLINICAL IMPRESSION(S) / ED DIAGNOSES   Final diagnoses:  Lower abdominal pain  Diverticulitis of large intestine without perforation or abscess without bleeding       Lisa Roca, MD 03/18/15 1726

## 2015-04-12 ENCOUNTER — Ambulatory Visit (INDEPENDENT_AMBULATORY_CARE_PROVIDER_SITE_OTHER): Payer: 59 | Admitting: Family Medicine

## 2015-04-12 ENCOUNTER — Encounter: Payer: Self-pay | Admitting: Family Medicine

## 2015-04-12 VITALS — BP 102/70 | HR 84 | Temp 97.7°F | Ht 71.7 in | Wt 198.0 lb

## 2015-04-12 DIAGNOSIS — K5732 Diverticulitis of large intestine without perforation or abscess without bleeding: Secondary | ICD-10-CM

## 2015-04-12 DIAGNOSIS — I7090 Unspecified atherosclerosis: Secondary | ICD-10-CM | POA: Diagnosis not present

## 2015-04-12 DIAGNOSIS — R911 Solitary pulmonary nodule: Secondary | ICD-10-CM | POA: Diagnosis not present

## 2015-04-12 DIAGNOSIS — R1030 Lower abdominal pain, unspecified: Secondary | ICD-10-CM

## 2015-04-12 DIAGNOSIS — Z1211 Encounter for screening for malignant neoplasm of colon: Secondary | ICD-10-CM

## 2015-04-12 DIAGNOSIS — K449 Diaphragmatic hernia without obstruction or gangrene: Secondary | ICD-10-CM | POA: Insufficient documentation

## 2015-04-12 LAB — UA/M W/RFLX CULTURE, ROUTINE
Bilirubin, UA: NEGATIVE
GLUCOSE, UA: NEGATIVE
Ketones, UA: NEGATIVE
LEUKOCYTES UA: NEGATIVE
NITRITE UA: NEGATIVE
PH UA: 5.5 (ref 5.0–7.5)
PROTEIN UA: NEGATIVE
RBC UA: NEGATIVE
Specific Gravity, UA: 1.025 (ref 1.005–1.030)
UUROB: 0.2 mg/dL (ref 0.2–1.0)

## 2015-04-12 NOTE — Progress Notes (Signed)
BP 102/70 mmHg  Pulse 84  Temp(Src) 97.7 F (36.5 C)  Ht 5' 11.7" (1.821 m)  Wt 198 lb (89.812 kg)  BMI 27.08 kg/m2  SpO2 99%   Subjective:    Patient ID: Aaron Kelley, male    DOB: 1960/03/27, 55 y.o.   MRN: [id number]  HPI: Aaron Kelley is a 55 y.o. male who presents today to establish care because his previous doctor is retiring. He has been following with him regularly  Chief Complaint  Patient presents with  . Establish Care  . Diverticulitis   ABDOMINAL ISSUES- about a month ago. Very swollen. Painful. Had a lot of swelling. Went to the ER and was diagnosed with diverticulitis. Had more pain the following weekend.  Duration: almost a month, still not feeling like he's himself, definitely better, but not normal Nature: sharp and shooting Location: suprapubic and RLQ  Severity: moderate  Radiation: no Episode duration: quick, couple of seconds Frequency: intermittent, few times a day Alleviating factors: gentle diet, antibiotics Aggravating factors: nothing Treatments attempted: PPI and laxatives Constipation: no Diarrhea: no Mucous in the stool: no Heartburn: no Bloating:no Flatulence: no Nausea: no Vomiting: no Melena or hematochezia: no Rash: no Jaundice: no Fever: no Weight loss: yes  Active Ambulatory Problems    Diagnosis Date Noted  . Acute MI, inferior wall, subsequent episode of care ([place]) 08/19/2012  . Hyperlipidemia with target LDL less than 70 08/19/2012  . Tobacco use 08/19/2012  . Evaluate for Sleep apnea 07/04/2013  . CAD (coronary artery disease) 02/09/2014  . Environmental and seasonal allergies 08/16/2014  . PVC's (premature ventricular contractions) 08/16/2014  . Atypical chest pain 12/14/2014  . Old MI (myocardial infarction) 12/14/2014  . Solitary pulmonary nodule on lung CT 04/12/2015  . Atherosclerosis 04/12/2015  . Hiatal hernia 04/12/2015  . Diverticulitis of sigmoid colon 04/12/2015   Resolved Ambulatory Problems    Diagnosis Date Noted  . No Resolved Ambulatory Problems   Past Medical History  Diagnosis Date  . Coronary artery disease 05/2012  . MI, old   . Hypercholesteremia   . GERD (gastroesophageal reflux disease)   . Bone spur of left foot    Past Surgical History  Procedure Laterality Date  . Rt. acl surgery  1993    [facility]  . Appendectomy  1972  . Cardiac surgery     Family History  Problem Relation Age of Onset  . Heart disease Father   . Heart attack Father   . Heart disease Brother   . Heart attack Brother   . Heart disease Paternal Grandfather   . Heart attack Mother    Social History  Substance Use Topics  . Smoking status: Former Smoker -- 1.00 packs/day for 35 years    Types: Cigarettes    Quit date: 06/03/2012  . Smokeless tobacco: Current User    Types: Snuff  . Alcohol Use: No    Review of Systems  Constitutional: Negative.   Respiratory: Negative.   Cardiovascular: Negative.   Gastrointestinal: Positive for abdominal pain. Negative for nausea, vomiting, diarrhea, constipation, blood in stool, abdominal distention, anal bleeding and rectal pain.  Musculoskeletal: Negative.   Psychiatric/Behavioral: Negative.     Per HPI unless specifically indicated above     Objective:    BP 102/70 mmHg  Pulse 84  Temp(Src) 97.7 F (36.5 C)  Ht 5' 11.7" (1.821 m)  Wt 198 lb (89.812 kg)  BMI 27.08 kg/m2  SpO2 99%  Wt Readings from Last 3  Encounters:  04/12/15 198 lb (89.812 kg)  03/18/15 208 lb (94.348 kg)  03/06/15 208 lb (94.348 kg)    Physical Exam  Constitutional: He is oriented to person, place, and time. He appears well-developed and well-nourished. No distress.  HENT:  Head: Normocephalic and atraumatic.  Right Ear: Hearing normal.  Left Ear: Hearing normal.  Nose: Nose normal.  Eyes: Conjunctivae and lids are normal. Right eye exhibits no discharge. Left eye exhibits no discharge. No scleral icterus.  Cardiovascular: Normal rate, regular  rhythm, normal heart sounds and intact distal pulses.  Exam reveals no gallop and no friction rub.   No murmur heard. Pulmonary/Chest: Effort normal and breath sounds normal. No respiratory distress. He has no wheezes. He has no rales. He exhibits no tenderness.  Abdominal: Soft. Bowel sounds are normal. He exhibits no distension and no mass. There is tenderness. There is no rebound and no guarding.  Musculoskeletal: Normal range of motion.  Neurological: He is alert and oriented to person, place, and time.  Skin: Skin is warm, dry and intact. No rash noted. No erythema. No pallor.  Psychiatric: He has a normal mood and affect. His speech is normal and behavior is normal. Judgment and thought content normal. Cognition and memory are normal.  Nursing note and vitals reviewed.   Results for orders placed or performed during the hospital encounter of 03/18/15  Lipase, blood  Result Value Ref Range   Lipase 18 11 - 51 U/L  Comprehensive metabolic panel  Result Value Ref Range   Sodium 138 135 - 145 mmol/L   Potassium 3.7 3.5 - 5.1 mmol/L   Chloride 106 101 - 111 mmol/L   CO2 26 22 - 32 mmol/L   Glucose, Bld 87 65 - 99 mg/dL   BUN 13 6 - 20 mg/dL   Creatinine, Ser 1.13 0.61 - 1.24 mg/dL   Calcium 9.1 8.9 - 10.3 mg/dL   Total Protein 6.8 6.5 - 8.1 g/dL   Albumin 4.1 3.5 - 5.0 g/dL   AST 19 15 - 41 U/L   ALT 34 17 - 63 U/L   Alkaline Phosphatase 57 38 - 126 U/L   Total Bilirubin 1.0 0.3 - 1.2 mg/dL   GFR calc non Af Amer >60 >60 mL/min   GFR calc Af Amer >60 >60 mL/min   Anion gap 6 5 - 15  CBC  Result Value Ref Range   WBC 14.0 (H) 3.8 - 10.6 K/uL   RBC 4.69 4.40 - 5.90 MIL/uL   Hemoglobin 13.3 13.0 - 18.0 g/dL   HCT 39.1 (L) 40.0 - 52.0 %   MCV 83.3 80.0 - 100.0 fL   MCH 28.4 26.0 - 34.0 pg   MCHC 34.0 32.0 - 36.0 g/dL   RDW 15.0 (H) 11.5 - 14.5 %   Platelets 136 (L) 150 - 440 K/uL  Urinalysis complete, with microscopic ([facility] only)  Result Value Ref Range   Color, Urine  YELLOW (A) YELLOW   APPearance CLEAR (A) CLEAR   Glucose, UA NEGATIVE NEGATIVE mg/dL   Bilirubin Urine NEGATIVE NEGATIVE   Ketones, ur NEGATIVE NEGATIVE mg/dL   Specific Gravity, Urine 1.030 1.005 - 1.030   Hgb urine dipstick NEGATIVE NEGATIVE   pH 5.0 5.0 - 8.0   Protein, ur NEGATIVE NEGATIVE mg/dL   Nitrite NEGATIVE NEGATIVE   Leukocytes, UA NEGATIVE NEGATIVE   RBC / HPF 0-5 0 - 5 RBC/hpf   WBC, UA 0-5 0 - 5 WBC/hpf   Bacteria, UA NONE SEEN  NONE SEEN   Squamous Epithelial / LPF 0-5 (A) NONE SEEN   Mucous PRESENT   Troponin I  Result Value Ref Range   Troponin I <0.03 <0.031 ng/mL      Assessment & Plan:   Problem List Items Addressed This Visit      Cardiovascular and Mediastinum   Atherosclerosis    Continue to monitor and keep BP and cholesterol under good control.        Digestive   Diverticulitis of sigmoid colon - Primary    Has finished his antibiotics. Feeling better. Diet discussed today. Will refer to GI for eval and screening colonoscopy. Call with any problems. Information provided to patient.      Relevant Orders   Ambulatory referral to Gastroenterology     Other   Solitary pulmonary nodule on lung CT    Needs repeat December 2017       Other Visit Diagnoses    Lower abdominal pain        Possibly still irritation. UA negative. Continue to monitor. Referral to GI made today.    Relevant Orders    UA/M w/rflx Culture, Routine    Encounter for screening colonoscopy        Referral generated today.     Relevant Orders    Ambulatory referral to Gastroenterology        Follow up plan: Return in about 3 months (around 07/11/2015).

## 2015-04-12 NOTE — Assessment & Plan Note (Signed)
Needs repeat December 2017

## 2015-04-12 NOTE — Assessment & Plan Note (Signed)
Has finished his antibiotics. Feeling better. Diet discussed today. Will refer to GI for eval and screening colonoscopy. Call with any problems. Information provided to patient.

## 2015-04-12 NOTE — Patient Instructions (Signed)
Diverticulitis Diverticulitis is inflammation or infection of small pouches in your colon that form when you have a condition called diverticulosis. The pouches in your colon are called diverticula. Your colon, or large intestine, is where water is absorbed and stool is formed. Complications of diverticulitis can include:  Bleeding.  Severe infection.  Severe pain.  Perforation of your colon.  Obstruction of your colon. CAUSES  Diverticulitis is caused by bacteria. Diverticulitis happens when stool becomes trapped in diverticula. This allows bacteria to grow in the diverticula, which can lead to inflammation and infection. RISK FACTORS People with diverticulosis are at risk for diverticulitis. Eating a diet that does not include enough fiber from fruits and vegetables may make diverticulitis more likely to develop. SYMPTOMS  Symptoms of diverticulitis may include:  Abdominal pain and tenderness. The pain is normally located on the left side of the abdomen, but may occur in other areas.  Fever and chills.  Bloating.  Cramping.  Nausea.  Vomiting.  Constipation.  Diarrhea.  Blood in your stool. DIAGNOSIS  Your health care provider will ask you about your medical history and do a physical exam. You may need to have tests done because many medical conditions can cause the same symptoms as diverticulitis. Tests may include:  Blood tests.  Urine tests.  Imaging tests of the abdomen, including X-rays and CT scans. When your condition is under control, your health care provider may recommend that you have a colonoscopy. A colonoscopy can show how severe your diverticula are and whether something else is causing your symptoms. TREATMENT  Most cases of diverticulitis are mild and can be treated at home. Treatment may include:  Taking over-the-counter pain medicines.  Following a clear liquid diet.  Taking antibiotic medicines by mouth for 7-10 days. More severe cases may  be treated at a hospital. Treatment may include:  Not eating or drinking.  Taking prescription pain medicine.  Receiving antibiotic medicines through an IV tube.  Receiving fluids and nutrition through an IV tube.  Surgery. HOME CARE INSTRUCTIONS   Follow your health care provider's instructions carefully.  Follow a full liquid diet or other diet as directed by your health care provider. After your symptoms improve, your health care provider may tell you to change your diet. He or she may recommend you eat a high-fiber diet. Fruits and vegetables are good sources of fiber. Fiber makes it easier to pass stool.  Take fiber supplements or probiotics as directed by your health care provider.  Only take medicines as directed by your health care provider.  Keep all your follow-up appointments. SEEK MEDICAL CARE IF:   Your pain does not improve.  You have a hard time eating food.  Your bowel movements do not return to normal. SEEK IMMEDIATE MEDICAL CARE IF:   Your pain becomes worse.  Your symptoms do not get better.  Your symptoms suddenly get worse.  You have a fever.  You have repeated vomiting.  You have bloody or black, tarry stools. MAKE SURE YOU:   Understand these instructions.  Will watch your condition.  Will get help right away if you are not doing well or get worse.   This information is not intended to replace advice given to you by your health care provider. Make sure you discuss any questions you have with your health care provider.   Document Released: 12/12/2004 Document Revised: 03/09/2013 Document Reviewed: 01/27/2013 Elsevier Interactive Patient Education 2016 Elsevier Inc.    Diverticulosis Diverticulosis is the condition that   pouches (diverticula) form in the wall of your colon. Your colon, or large intestine, is where water is absorbed and stool is formed. The pouches form when the inside layer of your colon pushes through weak  spots in the outer layers of your colon. CAUSES  No one knows exactly what causes diverticulosis. RISK FACTORS  Being older than 75. Your risk for this condition increases with age. Diverticulosis is rare in people younger than 40 years. By age 3, almost everyone has it.  Eating a low-fiber diet.  Being frequently constipated.  Being overweight.  Not getting enough exercise.  Smoking.  Taking over-the-counter pain medicines, like aspirin and ibuprofen. SYMPTOMS  Most people with diverticulosis do not have symptoms. DIAGNOSIS  Because diverticulosis often has no symptoms, health care providers often discover the condition during an exam for other colon problems. In many cases, a health care provider will diagnose diverticulosis while using a flexible scope to examine the colon (colonoscopy). TREATMENT  If you have never developed an infection related to diverticulosis, you may not need treatment. If you have had an infection before, treatment may include:  Eating more fruits, vegetables, and grains.  Taking a fiber supplement.  Taking a live bacteria supplement (probiotic).  Taking medicine to relax your colon. HOME CARE INSTRUCTIONS   Drink at least 6-8 glasses of water each day to prevent constipation.  Try not to strain when you have a bowel movement.  Keep all follow-up appointments. If you have had an infection before:  Increase the fiber in your diet as directed by your health care provider or dietitian.  Take a dietary fiber supplement if your health care provider approves.  Only take medicines as directed by your health care provider. SEEK MEDICAL CARE IF:   You have abdominal pain.  You have bloating.  You have cramps.  You have not gone to the bathroom in 3 days. SEEK IMMEDIATE MEDICAL CARE IF:   Your pain gets worse.  Yourbloating becomes very bad.  You have a fever or chills, and your symptoms suddenly get worse.  You begin vomiting.  You  have bowel movements that are bloody or black. MAKE SURE YOU:  Understand these instructions.  Will watch your condition.  Will get help right away if you are not doing well or get worse.   This information is not intended to replace advice given to you by your health care provider. Make sure you discuss any questions you have with your health care provider.   Document Released: 11/30/2003 Document Revised: 03/09/2013 Document Reviewed: 01/27/2013 Elsevier Interactive Patient Education 2016 Elsevier Inc. High-Fiber Diet Fiber, also called dietary fiber, is a type of carbohydrate found in fruits, vegetables, whole grains, and beans. A high-fiber diet can have many health benefits. Your health care provider may recommend a high-fiber diet to help:  Prevent constipation. Fiber can make your bowel movements more regular.  Lower your cholesterol.  Relieve hemorrhoids, uncomplicated diverticulosis, or irritable bowel syndrome.  Prevent overeating as part of a weight-loss plan.  Prevent heart disease, type 2 diabetes, and certain cancers. WHAT IS MY PLAN? The recommended daily intake of fiber includes:  38 grams for men under age 60.  73 grams for men over age 41.  27 grams for women under age 36.  21 grams for women over age 27. You can get the recommended daily intake of dietary fiber by eating a variety of fruits, vegetables, grains, and beans. Your health care provider may also recommend a fiber  supplement if it is not possible to get enough fiber through your diet. WHAT DO I NEED TO KNOW ABOUT A HIGH-FIBER DIET?  Fiber supplements have not been widely studied for their effectiveness, so it is better to get fiber through food sources.  Always check the fiber content on thenutrition facts label of any prepackaged food. Look for foods that contain at least 5 grams of fiber per serving.  Ask your dietitian if you have questions about specific foods that are related to your  condition, especially if those foods are not listed in the following section.  Increase your daily fiber consumption gradually. Increasing your intake of dietary fiber too quickly may cause bloating, cramping, or gas.  Drink plenty of water. Water helps you to digest fiber. WHAT FOODS CAN I EAT? Grains Whole-grain breads. Multigrain cereal. Oats and oatmeal. Brown rice. Barley. Bulgur wheat. [place]. Bran muffins. Popcorn. Rye wafer crackers. Vegetables Sweet potatoes. Spinach. Kale. Artichokes. Cabbage. Broccoli. Green peas. Carrots. Squash. Fruits Berries. Pears. Apples. Oranges. Avocados. Prunes and raisins. Dried figs. Meats and Other Protein Sources [place], kidney, pinto, and soy beans. Split peas. Lentils. Nuts and seeds. Dairy Fiber-fortified yogurt. Beverages Fiber-fortified soy milk. Fiber-fortified orange juice. Other Fiber bars. The items listed above may not be a complete list of recommended foods or beverages. Contact your dietitian for more options. WHAT FOODS ARE NOT RECOMMENDED? Grains White bread. Pasta made with refined flour. White rice. Vegetables Fried potatoes. Canned vegetables. Well-cooked vegetables.  Fruits Fruit juice. Cooked, strained fruit. Meats and Other Protein Sources Fatty cuts of meat. Fried Sales executive or fried fish. Dairy Milk. Yogurt. Cream cheese. Sour cream. Beverages Soft drinks. Other Cakes and pastries. Butter and oils. The items listed above may not be a complete list of foods and beverages to avoid. Contact your dietitian for more information. WHAT ARE SOME TIPS FOR INCLUDING HIGH-FIBER FOODS IN MY DIET?  Eat a wide variety of high-fiber foods.  Make sure that half of all grains consumed each day are whole grains.  Replace breads and cereals made from refined flour or white flour with whole-grain breads and cereals.  Replace white rice with brown rice, bulgur wheat, or millet.  Start the day with a breakfast that is high in fiber,  such as a cereal that contains at least 5 grams of fiber per serving.  Use beans in place of meat in soups, salads, or pasta.  Eat high-fiber snacks, such as berries, raw vegetables, nuts, or popcorn.   This information is not intended to replace advice given to you by your health care provider. Make sure you discuss any questions you have with your health care provider.   Document Released: 03/04/2005 Document Revised: 03/25/2014 Document Reviewed: 08/17/2013 Elsevier Interactive Patient Education [employer].

## 2015-04-12 NOTE — Assessment & Plan Note (Signed)
Continue to monitor and keep BP and cholesterol under good control.

## 2015-04-19 ENCOUNTER — Telehealth: Payer: Self-pay

## 2015-04-19 ENCOUNTER — Other Ambulatory Visit: Payer: Self-pay

## 2015-04-19 DIAGNOSIS — Z1211 Encounter for screening for malignant neoplasm of colon: Secondary | ICD-10-CM

## 2015-04-19 MED ORDER — NA SULFATE-K SULFATE-MG SULF 17.5-3.13-1.6 GM/177ML PO SOLN: 1.0000 | ORAL | Status: DC

## 2015-04-19 NOTE — Telephone Encounter (Signed)
Gastroenterology Pre-Procedure Review  Request Date: 04/28/15 Requesting Physician: Dr. Park Liter  PATIENT REVIEW QUESTIONS: The patient responded to the following health history questions as indicated:    1. Are you having any GI issues? no 2. Do you have a personal history of Polyps? no 3. Do you have a family history of Colon Cancer or Polyps? no 4. Diabetes Mellitus? no 5. Joint replacements in the past 12 months?no 6. Major health problems in the past 3 months?no 7. Any artificial heart valves, MVP, or defibrillator? Stents placed March 2014    MEDICATIONS & ALLERGIES:    Patient reports the following regarding taking any anticoagulation/antiplatelet therapy:   Plavix, Coumadin, Eliquis, Xarelto, Lovenox, Pradaxa, Brilinta, or Effient? yes (Plavix 75mg ) Aspirin? yes (ASA 81mg )  Patient confirms/reports the following medications:  Current Outpatient Prescriptions  Medication Sig Dispense Refill  . aspirin 81 MG tablet Take 81 mg by mouth daily.    Marland Kitchen atorvastatin (LIPITOR) 20 MG tablet Take 20 mg by mouth daily.    . clopidogrel (PLAVIX) 75 MG tablet Take 75 mg by mouth daily.    . clorazepate (TRANXENE) 7.5 MG tablet Take 7.5 mg by mouth 2 (two) times daily as needed for anxiety. prn    . metoprolol tartrate (LOPRESSOR) 25 MG tablet Take 12.5 mg by mouth 2 (two) times daily.    . nitroGLYCERIN (NITROSTAT) 0.4 MG SL tablet Place 0.4 mg under the tongue every 5 (five) minutes as needed for chest pain.    . pantoprazole (PROTONIX) 40 MG tablet Take 1 tablet by mouth daily.    Marland Kitchen zolpidem (AMBIEN) 10 MG tablet      No current facility-administered medications for this visit.    Patient confirms/reports the following allergies:  Allergies  Allergen Reactions  . Lisinopril Other (See Comments)    Severe chest pain and fatigue    No orders of the defined types were placed in this encounter.    AUTHORIZATION INFORMATION Primary Insurance: 1D#: Group #:  Secondary  Insurance: 1D#: Group #:  SCHEDULE INFORMATION: Date: 04/28/15 Time: Location: [facility]

## 2015-04-21 ENCOUNTER — Telehealth: Payer: Self-pay | Admitting: *Deleted

## 2015-04-21 ENCOUNTER — Telehealth: Payer: Self-pay

## 2015-04-21 ENCOUNTER — Telehealth: Payer: Self-pay | Admitting: Cardiovascular Disease

## 2015-04-21 ENCOUNTER — Encounter: Payer: Self-pay | Admitting: *Deleted

## 2015-04-21 NOTE — Telephone Encounter (Signed)
Faxed surgical clearance to have colonoscopy and hold plavix for 5 days.

## 2015-04-21 NOTE — Telephone Encounter (Signed)
New message      Calling to check the status on a clearance for a colonoscopy from Dr Verl Blalock in [place].  It is scheduled for next Friday 04-28-15.

## 2015-04-21 NOTE — Telephone Encounter (Signed)
Please call this patient for me and let him know this was faxed back today.Drv kelly has been out of the office so Gaspar Bidding signed it .  Thanks.

## 2015-04-21 NOTE — Telephone Encounter (Signed)
Contacted pt regarding blood thinner form. Pt stated he had already received a call from Dr. Evette Georges office about when to stop Plavix. Pt was told he would be told after his procedure when to restart medication. Blood thinner form is in [place].

## 2015-04-21 NOTE — Telephone Encounter (Signed)
Returned call and gave pt instruction to hold plavix 5 days prior to procedure, informed he is OK for procedure and a clearance has been faxed. Pt aware and voiced understanding.

## 2015-04-25 NOTE — Discharge Instructions (Signed)

## 2015-04-28 ENCOUNTER — Ambulatory Visit
Admission: RE | Admit: 2015-04-28 | Discharge: 2015-04-28 | Disposition: A | Discharge status: Home / Self Care | Payer: 59 | Source: Ambulatory Visit | Attending: Gastroenterology | Admitting: Gastroenterology

## 2015-04-28 ENCOUNTER — Ambulatory Visit: Payer: 59 | Admitting: Anesthesiology

## 2015-04-28 ENCOUNTER — Other Ambulatory Visit: Payer: Self-pay | Admitting: Gastroenterology

## 2015-04-28 ENCOUNTER — Encounter
Admission: RE | Disposition: A | Discharge status: Home / Self Care | Payer: Self-pay | Source: Ambulatory Visit | Attending: Gastroenterology

## 2015-04-28 DIAGNOSIS — K641 Second degree hemorrhoids: Secondary | ICD-10-CM | POA: Insufficient documentation

## 2015-04-28 DIAGNOSIS — Z7982 Long term (current) use of aspirin: Secondary | ICD-10-CM | POA: Diagnosis not present

## 2015-04-28 DIAGNOSIS — Z8249 Family history of ischemic heart disease and other diseases of the circulatory system: Secondary | ICD-10-CM | POA: Insufficient documentation

## 2015-04-28 DIAGNOSIS — Z87891 Personal history of nicotine dependence: Secondary | ICD-10-CM | POA: Insufficient documentation

## 2015-04-28 DIAGNOSIS — E7800 Pure hypercholesterolemia, unspecified: Secondary | ICD-10-CM | POA: Diagnosis not present

## 2015-04-28 DIAGNOSIS — I252 Old myocardial infarction: Secondary | ICD-10-CM | POA: Insufficient documentation

## 2015-04-28 DIAGNOSIS — K5730 Diverticulosis of large intestine without perforation or abscess without bleeding: Secondary | ICD-10-CM | POA: Insufficient documentation

## 2015-04-28 DIAGNOSIS — Z955 Presence of coronary angioplasty implant and graft: Secondary | ICD-10-CM | POA: Diagnosis not present

## 2015-04-28 DIAGNOSIS — I2510 Atherosclerotic heart disease of native coronary artery without angina pectoris: Secondary | ICD-10-CM | POA: Insufficient documentation

## 2015-04-28 DIAGNOSIS — Z79899 Other long term (current) drug therapy: Secondary | ICD-10-CM | POA: Diagnosis not present

## 2015-04-28 DIAGNOSIS — Z888 Allergy status to other drugs, medicaments and biological substances status: Secondary | ICD-10-CM | POA: Insufficient documentation

## 2015-04-28 DIAGNOSIS — Z1211 Encounter for screening for malignant neoplasm of colon: Secondary | ICD-10-CM | POA: Diagnosis present

## 2015-04-28 DIAGNOSIS — D125 Benign neoplasm of sigmoid colon: Secondary | ICD-10-CM | POA: Diagnosis not present

## 2015-04-28 DIAGNOSIS — K219 Gastro-esophageal reflux disease without esophagitis: Secondary | ICD-10-CM | POA: Insufficient documentation

## 2015-04-28 HISTORY — PX: COLONOSCOPY WITH PROPOFOL: SHX5780

## 2015-04-28 HISTORY — DX: Diverticulitis of intestine, part unspecified, without perforation or abscess without bleeding: K57.92

## 2015-04-28 HISTORY — PX: POLYPECTOMY: SHX5525

## 2015-04-28 SURGERY — COLONOSCOPY WITH PROPOFOL
Anesthesia: Monitor Anesthesia Care

## 2015-04-28 MED ORDER — SIMETHICONE 40 MG/0.6ML PO SUSP
ORAL | Status: DC | PRN
  Administered 2015-04-28: 11:00:00

## 2015-04-28 MED ORDER — LACTATED RINGERS IV SOLN
INTRAVENOUS | Status: DC
  Administered 2015-04-28: 10:00:00 via INTRAVENOUS

## 2015-04-28 MED ORDER — PROPOFOL 10 MG/ML IV BOLUS
INTRAVENOUS | Status: DC | PRN
  Administered 2015-04-28: 50 mg via INTRAVENOUS
  Administered 2015-04-28: 100 mg via INTRAVENOUS
  Administered 2015-04-28: 50 mg via INTRAVENOUS

## 2015-04-28 MED ORDER — ONDANSETRON HCL 4 MG/2ML IJ SOLN: 4.0000 mg | Freq: Once | INTRAMUSCULAR | Status: DC | PRN

## 2015-04-28 MED ORDER — LIDOCAINE HCL (CARDIAC) 20 MG/ML IV SOLN
INTRAVENOUS | Status: DC | PRN
  Administered 2015-04-28: 50 mg via INTRAVENOUS

## 2015-04-28 MED ORDER — ACETAMINOPHEN 160 MG/5ML PO SOLN: 325.0000 mg | ORAL | Status: DC | PRN

## 2015-04-28 MED ORDER — ACETAMINOPHEN 325 MG PO TABS: 325.0000 mg | ORAL_TABLET | ORAL | Status: DC | PRN

## 2015-04-28 MED ORDER — SODIUM CHLORIDE 0.9 % IV SOLN: INTRAVENOUS | Status: DC

## 2015-04-28 SURGICAL SUPPLY — 28 items
CANISTER SUCT 1200ML W/VALVE (MISCELLANEOUS) ×4 IMPLANT
FCP ESCP3.2XJMB 240X2.8X (MISCELLANEOUS)
FORCEPS BIOP RAD 4 LRG CAP 4 (CUTTING FORCEPS) IMPLANT
FORCEPS BIOP RJ4 240 W/NDL (MISCELLANEOUS)
FORCEPS ESCP3.2XJMB 240X2.8X (MISCELLANEOUS) IMPLANT
GOWN CVR UNV OPN BCK APRN NK (MISCELLANEOUS) ×4 IMPLANT
GOWN ISOL THUMB LOOP REG UNIV (MISCELLANEOUS) ×4
HEMOCLIP INSTINCT (CLIP) IMPLANT
INJECTOR VARIJECT VIN23 (MISCELLANEOUS) IMPLANT
KIT CO2 TUBING (TUBING) IMPLANT
KIT DEFENDO VALVE AND CONN (KITS) IMPLANT
KIT ENDO PROCEDURE OLY (KITS) ×4 IMPLANT
LIGATOR MULTIBAND 6SHOOTER MBL (MISCELLANEOUS) IMPLANT
MARKER SPOT ENDO TATTOO 5ML (MISCELLANEOUS) IMPLANT
PAD GROUND ADULT SPLIT (MISCELLANEOUS) IMPLANT
SNARE SHORT THROW 13M SML OVAL (MISCELLANEOUS) IMPLANT
SNARE SHORT THROW 30M LRG OVAL (MISCELLANEOUS) ×4 IMPLANT
SPOT EX ENDOSCOPIC TATTOO (MISCELLANEOUS)
SUCTION POLY TRAP 4CHAMBER (MISCELLANEOUS) IMPLANT
TRAP SUCTION POLY (MISCELLANEOUS) ×4 IMPLANT
TUBING CONN 6MMX3.1M (TUBING)
TUBING SUCTION CONN 0.25 STRL (TUBING) IMPLANT
UNDERPAD 30X60 958B10 (PK) (MISCELLANEOUS) IMPLANT
VALVE BIOPSY ENDO (VALVE) IMPLANT
VARIJECT INJECTOR VIN23 (MISCELLANEOUS)
WATER AUXILLARY (MISCELLANEOUS) IMPLANT
WATER STERILE IRR 250ML POUR (IV SOLUTION) ×4 IMPLANT
WATER STERILE IRR 500ML POUR (IV SOLUTION) IMPLANT

## 2015-04-28 NOTE — Anesthesia Preprocedure Evaluation (Addendum)
Anesthesia Evaluation  Patient identified by MRN, date of birth, ID band Patient awake    Reviewed: Allergy & Precautions, NPO status , Patient's Chart, lab work & pertinent test results, reviewed documented beta blocker date and time   Airway Mallampati: I  TM Distance: >3 FB Neck ROM: Full    Dental no notable dental hx.    Pulmonary sleep apnea , former smoker,    Pulmonary exam normal        Cardiovascular + CAD, + Past MI and + Cardiac Stents  Normal cardiovascular exam     Neuro/Psych negative neurological ROS  negative psych ROS   GI/Hepatic Neg liver ROS, hiatal hernia, GERD  Medicated and Controlled,  Endo/Other  negative endocrine ROS  Renal/GU negative Renal ROS     Musculoskeletal negative musculoskeletal ROS (+)   Abdominal   Peds  Hematology negative hematology ROS (+)   Anesthesia Other Findings   Reproductive/Obstetrics                            Anesthesia Physical Anesthesia Plan  ASA: II  Anesthesia Plan: MAC   Post-op Pain Management:    Induction: Intravenous  Airway Management Planned:   Additional Equipment:   Intra-op Plan:   Post-operative Plan:   Informed Consent: I have reviewed the patients History and Physical, chart, labs and discussed the procedure including the risks, benefits and alternatives for the proposed anesthesia with the patient or authorized representative who has indicated his/her understanding and acceptance.     Plan Discussed with: CRNA  Anesthesia Plan Comments:         Anesthesia Quick Evaluation

## 2015-04-28 NOTE — Anesthesia Postprocedure Evaluation (Signed)
Anesthesia Post Note  Patient: Aaron Kelley  Procedure(s) Performed: Procedure(s) (LRB): COLONOSCOPY WITH PROPOFOL (N/A) POLYPECTOMY  Patient location during evaluation: PACU Anesthesia Type: MAC Level of consciousness: awake and alert and oriented Pain management: pain level controlled Vital Signs Assessment: post-procedure vital signs reviewed and stable Respiratory status: spontaneous breathing and nonlabored ventilation Cardiovascular status: stable Postop Assessment: no signs of nausea or vomiting and adequate PO intake Anesthetic complications: no    Estill Batten

## 2015-04-28 NOTE — Op Note (Signed)
[facility] Gastroenterology Patient Name: Aaron Kelley Procedure Date: 04/28/2015 10:36 AM MRN: [id number] Account #: [id number] Date of Birth: 06-07-1960 Admit Type: Outpatient Age: 55 Room: [facility] OR ROOM 01 Gender: Male Note Status: Finalized Procedure:         Colonoscopy Indications:       Screening for colorectal malignant neoplasm Providers:         Lucilla Lame, MD Referring MD:      Valerie Roys (Referring MD) Medicines:         Propofol per Anesthesia Complications:     No immediate complications. Procedure:         Pre-Anesthesia Assessment:                    - Prior to the procedure, a History and Physical was                     performed, and patient medications and allergies were                     reviewed. The patient's tolerance of previous anesthesia                     was also reviewed. The risks and benefits of the procedure                     and the sedation options and risks were discussed with the                     patient. All questions were answered, and informed consent                     was obtained. Prior Anticoagulants: The patient has taken                     no previous anticoagulant or antiplatelet agents. ASA                     Grade Assessment: II - A patient with mild systemic                     disease. After reviewing the risks and benefits, the                     patient was deemed in satisfactory condition to undergo                     the procedure.                    After obtaining informed consent, the colonoscope was                     passed under direct vision. Throughout the procedure, the                     patient's blood pressure, pulse, and oxygen saturations                     were monitored continuously. The was introduced through                     the anus and advanced to the the cecum, identified by  appendiceal orifice and ileocecal valve. The colonoscopy              was performed without difficulty. The patient tolerated                     the procedure well. The quality of the bowel preparation                     was excellent. Findings:      The perianal and digital rectal examinations were normal.      A 5 mm polyp was found in the sigmoid colon. The polyp was sessile. The       polyp was removed with a cold snare. Resection and retrieval were       complete.      Multiple small-mouthed diverticula were found in the sigmoid colon.      Non-bleeding internal hemorrhoids were found during retroflexion. The       hemorrhoids were Grade II (internal hemorrhoids that prolapse but reduce       spontaneously). Impression:        - One 5 mm polyp in the sigmoid colon. Resected and                     retrieved.                    - Diverticulosis in the sigmoid colon.                    - Non-bleeding internal hemorrhoids. Recommendation:    - Await pathology results.                    - Repeat colonoscopy in 5 years if polyp adenoma and 10                     years if hyperplastic Procedure Code(s): --- Professional ---                    [phone], Colonoscopy, flexible; with removal of tumor(s),                     polyp(s), or other lesion(s) by snare technique Diagnosis Code(s): --- Professional ---                    Z12.11, Encounter for screening for malignant neoplasm of                     colon                    D12.5, Benign neoplasm of sigmoid colon CPT copyright 2014 American Medical Association. All rights reserved. The codes documented in this report are preliminary and upon coder review may  be revised to meet current compliance requirements. Lucilla Lame, MD 04/28/2015 10:50:53 AM This report has been signed electronically. Number of Addenda: 0 Note Initiated On: 04/28/2015 10:36 AM Scope Withdrawal Time: 0 hours 6 minutes 37 seconds  Total Procedure Duration: 0 hours 9 minutes 36 seconds       [facility]

## 2015-04-28 NOTE — H&P (Signed)
[facility]  [address] [place], [place] [address] Phone: [phone] Fax : [phone]  Primary Care Physician:  Park Liter, DO Primary Gastroenterologist:  Dr. Allen Norris  Pre-Procedure History & Physical: HPI:  Aaron Kelley is a 55 y.o. male is here for a screening colonoscopy.   Past Medical History  Diagnosis Date  . Coronary artery disease 05/2012    MI tx. @ [facility] 2 stents placed   . MI, old   . Hypercholesteremia   . GERD (gastroesophageal reflux disease)   . Bone spur of left foot     Heel  . Diverticulitis     Past Surgical History  Procedure Laterality Date  . Rt. acl surgery  1993    [facility]  . Appendectomy  1972  . Cardiac surgery    . Cardiac catheterization      3/14(x2), 5/14    Prior to Admission medications   Medication Sig Start Date End Date Taking? Authorizing Provider  aspirin 81 MG tablet Take 81 mg by mouth daily.   Yes Historical Provider, MD  atorvastatin (LIPITOR) 20 MG tablet Take 20 mg by mouth daily.   Yes Historical Provider, MD  clorazepate (TRANXENE) 7.5 MG tablet Take 7.5 mg by mouth 2 (two) times daily as needed for anxiety. prn   Yes Historical Provider, MD  metoprolol tartrate (LOPRESSOR) 25 MG tablet Take 12.5 mg by mouth 2 (two) times daily.   Yes Historical Provider, MD  Na Sulfate-K Sulfate-Mg Sulf (SUPREP BOWEL PREP) SOLN Take 1 kit by mouth as directed. 04/19/15  Yes Lucilla Lame, MD  pantoprazole (PROTONIX) 40 MG tablet Take 1 tablet by mouth daily. 08/24/12  Yes Historical Provider, MD  zolpidem (AMBIEN) 10 MG tablet  03/30/15  Yes Historical Provider, MD  clopidogrel (PLAVIX) 75 MG tablet Take 75 mg by mouth daily.    Historical Provider, MD  nitroGLYCERIN (NITROSTAT) 0.4 MG SL tablet Place 0.4 mg under the tongue every 5 (five) minutes as needed for chest pain.    Historical Provider, MD    Allergies as of 04/19/2015 - Review Complete 04/19/2015  Allergen Reaction Noted  . Lisinopril Other  (See Comments) 09/07/2012    Family History  Problem Relation Age of Onset  . Heart disease Father   . Heart attack Father   . Heart disease Brother   . Heart attack Brother   . Heart disease Paternal Grandfather   . Heart attack Mother     Social History   Social History  . Marital Status: Married    Spouse Name: N/A  . Number of Children: N/A  . Years of Education: N/A   Occupational History  . Not on file.   Social History Main Topics  . Smoking status: Former Smoker -- 1.00 packs/day for 35 years    Types: Cigarettes    Quit date: 06/03/2012  . Smokeless tobacco: Current User    Types: Snuff     Comment: 1/2 can/day  . Alcohol Use: No  . Drug Use: No  . Sexual Activity: Not on file   Other Topics Concern  . Not on file   Social History Narrative    Review of Systems: See HPI, otherwise negative ROS  Physical Exam: BP 110/72 mmHg  Pulse 54  Temp(Src) 97.3 F (36.3 C) (Temporal)  Resp 16  Ht '6\' 1"'  (1.854 m)  Wt 196 lb (88.905 kg)  BMI 25.86 kg/m2  SpO2 100% General:   Alert,  pleasant and cooperative  in NAD Head:  Normocephalic and atraumatic. Neck:  Supple; no masses or thyromegaly. Lungs:  Clear throughout to auscultation.    Heart:  Regular rate and rhythm. Abdomen:  Soft, nontender and nondistended. Normal bowel sounds, without guarding, and without rebound.   Neurologic:  Alert and  oriented x4;  grossly normal neurologically.  Impression/Plan: Aaron Kelley is now here to undergo a screening colonoscopy.  Risks, benefits, and alternatives regarding colonoscopy have been reviewed with the patient.  Questions have been answered.  All parties agreeable.

## 2015-04-28 NOTE — Anesthesia Procedure Notes (Signed)
Procedure Name: MAC Performed by: Shrinika Blatz Pre-anesthesia Checklist: Patient identified, Emergency Drugs available, Suction available, Timeout performed and Patient being monitored Patient Re-evaluated:Patient Re-evaluated prior to inductionOxygen Delivery Method: Nasal cannula Placement Confirmation: positive ETCO2     

## 2015-04-28 NOTE — Transfer of Care (Signed)
Immediate Anesthesia Transfer of Care Note  Patient: Aaron Kelley  Procedure(s) Performed: Procedure(s): COLONOSCOPY WITH PROPOFOL (N/A)  Patient Location: PACU  Anesthesia Type: MAC  Level of Consciousness: awake, alert  and patient cooperative  Airway and Oxygen Therapy: Patient Spontanous Breathing and Patient connected to supplemental oxygen  Post-op Assessment: Post-op Vital signs reviewed, Patient's Cardiovascular Status Stable, Respiratory Function Stable, Patent Airway and No signs of Nausea or vomiting  Post-op Vital Signs: Reviewed and stable  Complications: No apparent anesthesia complications

## 2015-05-01 ENCOUNTER — Encounter: Payer: Self-pay | Admitting: Gastroenterology

## 2015-05-11 ENCOUNTER — Ambulatory Visit: Payer: 59 | Admitting: Gastroenterology

## 2015-05-22 ENCOUNTER — Other Ambulatory Visit: Payer: Self-pay | Admitting: Family Medicine

## 2015-05-22 ENCOUNTER — Encounter: Payer: Self-pay | Admitting: Family Medicine

## 2015-05-22 ENCOUNTER — Ambulatory Visit (INDEPENDENT_AMBULATORY_CARE_PROVIDER_SITE_OTHER): Payer: 59 | Admitting: Family Medicine

## 2015-05-22 ENCOUNTER — Telehealth: Payer: Self-pay | Admitting: Family Medicine

## 2015-05-22 VITALS — BP 113/73 | HR 65 | Temp 99.1°F | Ht 71.6 in | Wt 197.0 lb

## 2015-05-22 DIAGNOSIS — J101 Influenza due to other identified influenza virus with other respiratory manifestations: Secondary | ICD-10-CM | POA: Diagnosis not present

## 2015-05-22 DIAGNOSIS — R52 Pain, unspecified: Secondary | ICD-10-CM | POA: Diagnosis not present

## 2015-05-22 LAB — INFLUENZA A AND B
INFLUENZA A AG, EIA: POSITIVE — AB
Influenza B Ag, EIA: NEGATIVE

## 2015-05-22 MED ORDER — GUAIFENESIN-CODEINE 100-10 MG/5ML PO SYRP: 5.0000 mL | ORAL_SOLUTION | Freq: Three times a day (TID) | ORAL | Status: DC | PRN

## 2015-05-22 MED ORDER — HYDROCOD POLST-CPM POLST ER 10-8 MG/5ML PO SUER: 5.0000 mL | Freq: Every evening | ORAL | Status: DC | PRN

## 2015-05-22 MED ORDER — BENZONATATE 200 MG PO CAPS: 200.0000 mg | ORAL_CAPSULE | Freq: Two times a day (BID) | ORAL | Status: DC | PRN

## 2015-05-22 NOTE — Telephone Encounter (Signed)
Patient called stating that he went to the pharmacy and the cough medication prescribed to him is to much and he is not going to pay that much for it. I wants Dr. Wynetta Emery to give him something else less expensive.

## 2015-05-22 NOTE — Telephone Encounter (Signed)
Rx called into his pharmacy. Call with any concerns.

## 2015-05-22 NOTE — Telephone Encounter (Signed)
Pt called stated pharmacist stated the promethazine codene cough syrup would be $15 can that be called in for this pt. Pharm is [facility]. Thanks.

## 2015-05-22 NOTE — Telephone Encounter (Signed)
He can get the over the counter cough syrup with codeine at [facility], unfortunately, there is nothing else cheaper

## 2015-05-22 NOTE — Telephone Encounter (Signed)
Dr.Johnson, is there any other cough syrups that are cheaper?

## 2015-05-22 NOTE — Progress Notes (Signed)
BP 113/73 mmHg  Pulse 65  Temp(Src) 99.1 F (37.3 C)  Ht 5' 11.6" (1.819 m)  Wt 197 lb (89.359 kg)  BMI 27.01 kg/m2  SpO2 99%   Subjective:    Patient ID: Aaron Kelley, male    DOB: [birth year], 55 y.o.   MRN: [id number]  HPI: Aaron Kelley is a 55 y.o. male  Chief Complaint  Patient presents with  . URI    X 3days fever, cough, sore throat and chest pain   UPPER RESPIRATORY TRACT INFECTION Duration: Since Friday Worst symptom: chest tightness, body aches Fever: yes Cough: yes Shortness of breath: yes Wheezing: no Chest pain: yes Chest tightness: yes Chest congestion: yes Nasal congestion: yes Runny nose: yes Post nasal drip: yes Sneezing: no Sore throat: yes Swollen glands: no Sinus pressure: no Headache: yes Face pain: no Toothache: no Ear pain: yes bilateral Ear pressure: yes bilateral Eyes red/itching:no Eye drainage/crusting: no  Vomiting: no Rash: no Fatigue: yes Sick contacts: yes Strep contacts: no  Context: worse Recurrent sinusitis: no Relief with OTC cold/cough medications: no  Treatments attempted: none   Relevant past medical, surgical, family and social history reviewed and updated as indicated. Interim medical history since our last visit reviewed. Allergies and medications reviewed and updated.  Review of Systems  Constitutional: Negative.   HENT: Positive for congestion, ear pain, postnasal drip, rhinorrhea, sinus pressure, sneezing and sore throat. Negative for dental problem, drooling, ear discharge, facial swelling, hearing loss, mouth sores, nosebleeds, tinnitus, trouble swallowing and voice change.   Respiratory: Positive for cough, chest tightness, shortness of breath and wheezing. Negative for apnea, choking and stridor.   Cardiovascular: Negative.   Psychiatric/Behavioral: Negative.    Per HPI unless specifically indicated above     Objective:    BP 113/73 mmHg  Pulse 65  Temp(Src) 99.1 F (37.3 C)  Ht 5' 11.6"  (1.819 m)  Wt 197 lb (89.359 kg)  BMI 27.01 kg/m2  SpO2 99%  Wt Readings from Last 3 Encounters:  05/22/15 197 lb (89.359 kg)  04/28/15 196 lb (88.905 kg)  04/12/15 198 lb (89.812 kg)    Physical Exam  Constitutional: He is oriented to person, place, and time. He appears well-developed and well-nourished. No distress.  HENT:  Head: Normocephalic and atraumatic.  Right Ear: Hearing and external ear normal.  Left Ear: Hearing and external ear normal.  Nose: Nose normal.  Mouth/Throat: Oropharynx is clear and moist. No oropharyngeal exudate.  Eyes: Conjunctivae, EOM and lids are normal. Pupils are equal, round, and reactive to light. Right eye exhibits no discharge. Left eye exhibits no discharge. No scleral icterus.  Neck: Normal range of motion. Neck supple. No JVD present. No tracheal deviation present. No thyromegaly present.  Cardiovascular: Normal rate, regular rhythm, normal heart sounds and intact distal pulses.  Exam reveals no gallop and no friction rub.   No murmur heard. Pulmonary/Chest: Effort normal and breath sounds normal. No stridor. No respiratory distress. He has no wheezes. He has no rales. He exhibits no tenderness.  Musculoskeletal: Normal range of motion.  Lymphadenopathy:    He has cervical adenopathy.  Neurological: He is alert and oriented to person, place, and time.  Skin: Skin is warm, dry and intact. No rash noted. He is not diaphoretic. No erythema. No pallor.  Psychiatric: He has a normal mood and affect. His speech is normal and behavior is normal. Judgment and thought content normal. Cognition and memory are normal.  Nursing note and vitals reviewed.  Results for orders placed or performed in visit on 04/12/15  UA/M w/rflx Culture, Routine  Result Value Ref Range   Specific Gravity, UA 1.025 1.005 - 1.030   pH, UA 5.5 5.0 - 7.5   Color, UA Yellow Yellow   Appearance Ur Clear Clear   Leukocytes, UA Negative Negative   Protein, UA Negative  Negative/Trace   Glucose, UA Negative Negative   Ketones, UA Negative Negative   RBC, UA Negative Negative   Bilirubin, UA Negative Negative   Urobilinogen, Ur 0.2 0.2 - 1.0 mg/dL   Nitrite, UA Negative Negative      Assessment & Plan:   Problem List Items Addressed This Visit    None    Visit Diagnoses    Influenza A    -  Primary    Will treat with tussionex and tessalon perles. Rest. Out of work for 1 week. Call if not getting better or getting worse.     Body aches        + for influenza A        Follow up plan: Return if symptoms worsen or fail to improve.

## 2015-05-22 NOTE — Telephone Encounter (Signed)
Left message letting patient know Dr.Johnson's response.

## 2015-05-30 ENCOUNTER — Telehealth: Payer: Self-pay

## 2015-05-30 NOTE — Telephone Encounter (Signed)
That is not on his medication list. He would need an appointment for that as it's a muscle relaxer.

## 2015-05-30 NOTE — Telephone Encounter (Signed)
RX refill request for  Carisooprodol 350mg  1 tablet TID prn  [name]

## 2015-05-30 NOTE — Telephone Encounter (Signed)
Patient notified, he will call back and schedule an appointment if he needs it.

## 2015-06-19 ENCOUNTER — Encounter: Payer: Self-pay | Admitting: Gastroenterology

## 2015-06-28 ENCOUNTER — Telehealth: Payer: Self-pay | Admitting: Family Medicine

## 2015-06-28 MED ORDER — PANTOPRAZOLE SODIUM 40 MG PO TBEC: 40.0000 mg | DELAYED_RELEASE_TABLET | Freq: Every day | ORAL | Status: AC

## 2015-06-28 NOTE — Telephone Encounter (Signed)
Pt needs protonix sent to [facility]-- c[facility] scheduled 07/20/2015.

## 2015-06-28 NOTE — Telephone Encounter (Signed)
Forward to provider

## 2015-06-28 NOTE — Telephone Encounter (Signed)
Rx sent to her pharmacy 

## 2015-06-29 ENCOUNTER — Telehealth: Payer: Self-pay | Admitting: *Deleted

## 2015-06-29 NOTE — Telephone Encounter (Signed)
Dr Claiborne Billings is not in the office until Monday.

## 2015-06-29 NOTE — Telephone Encounter (Signed)
Patient is wanting to get clorazepate (TRANXENE) 7.5 MG tablet for nerves. Primary wont give it to him and wife wants to know if Dr. Claiborne Billings will. He is a nervous wreck due to recent family loss and issues.

## 2015-07-04 ENCOUNTER — Telehealth: Payer: Self-pay | Admitting: *Deleted

## 2015-07-04 ENCOUNTER — Encounter: Payer: Self-pay | Admitting: Family Medicine

## 2015-07-04 ENCOUNTER — Telehealth: Payer: Self-pay | Admitting: Family Medicine

## 2015-07-04 MED ORDER — ATORVASTATIN CALCIUM 20 MG PO TABS: 20.0000 mg | ORAL_TABLET | Freq: Every day | ORAL | Status: AC

## 2015-07-04 NOTE — Telephone Encounter (Signed)
Pt called and stated that his medication wasn't ready and at this time I told the pt that we needed to schedule him a follow up visit. He proceeded to yell and scream telling me that there weren't any real doctors in this office and that he wanted his D**n refill sent to his pharmacy. I explained that his follow up visit was to follow up with him and make sure everything was ok and to discuss our medication management program. He then insisted that we have his records ready for him to pick up so he could find a real doctor to give him his meds. At this point I tried to explain that Dr Wynetta Emery only wanted to follow up with him about a couple things and he stated that if he had a stroke of seizure and dies that his wife will own [facility]. I apologized that the pt felt as if we didn't want to refill his meds but ensured him that Dr Wynetta Emery was only trying to provide good care and make sure his meds were working properly. Pt then hung up.

## 2015-07-04 NOTE — Telephone Encounter (Signed)
Pt called stated he needs refills on the following:  Lipitor Tranxene  Pharm is [facility].  Original request sent to Dr. Evette Georges office, please inform pharmacy to send future requests here. Thanks.

## 2015-07-04 NOTE — Telephone Encounter (Signed)
Needs to be seen for chlorazepate as that is a benzo. Needs to discuss and sign controlled substance agreement.

## 2015-07-04 NOTE — Telephone Encounter (Signed)
Forward to provider, patient is scheduled for a CPE on May 4

## 2015-07-04 NOTE — Telephone Encounter (Signed)
called again for chlorazepate, also there is a phone note from PCP ofice regarding this, please refer to 07/04/15 phone note

## 2015-07-04 NOTE — Telephone Encounter (Signed)
Christan: Will you please get this patient scheduled for a follow up visit.

## 2015-07-05 NOTE — Telephone Encounter (Signed)
Dr Claiborne Billings please address. See OV notes from patiens PCP.

## 2015-07-11 ENCOUNTER — Ambulatory Visit: Payer: 59 | Admitting: Family Medicine

## 2015-07-20 ENCOUNTER — Encounter: Payer: 59 | Admitting: Family Medicine

## 2015-07-21 ENCOUNTER — Telehealth: Payer: Self-pay

## 2015-07-21 NOTE — Telephone Encounter (Signed)
He has left the practice and gone elsewhere.

## 2015-07-21 NOTE — Telephone Encounter (Signed)
Pharmacy is requesting a refill on the following, Clorazepate 7.5mg  take one tablet twice a day as needed   Is this a medication that you prescribe?

## 2015-07-25 ENCOUNTER — Encounter: Payer: Self-pay | Admitting: Cardiovascular Disease

## 2015-07-25 ENCOUNTER — Ambulatory Visit (INDEPENDENT_AMBULATORY_CARE_PROVIDER_SITE_OTHER): Payer: 59 | Admitting: Cardiovascular Disease

## 2015-07-25 VITALS — BP 112/83 | HR 64 | Ht 73.0 in | Wt 201.0 lb

## 2015-07-25 DIAGNOSIS — R5383 Other fatigue: Secondary | ICD-10-CM | POA: Insufficient documentation

## 2015-07-25 DIAGNOSIS — I252 Old myocardial infarction: Secondary | ICD-10-CM | POA: Diagnosis not present

## 2015-07-25 DIAGNOSIS — E785 Hyperlipidemia, unspecified: Secondary | ICD-10-CM | POA: Diagnosis not present

## 2015-07-25 DIAGNOSIS — R0789 Other chest pain: Secondary | ICD-10-CM

## 2015-07-25 DIAGNOSIS — I2510 Atherosclerotic heart disease of native coronary artery without angina pectoris: Secondary | ICD-10-CM | POA: Diagnosis not present

## 2015-07-25 DIAGNOSIS — R5382 Chronic fatigue, unspecified: Secondary | ICD-10-CM

## 2015-07-25 NOTE — Patient Instructions (Signed)
Your physician wants you to follow-up in: 6 months or sooner if needed. You will receive a reminder letter in the mail two months in advance. If you don't receive a letter, please call our office to schedule the follow-up appointment.   If you need a refill on your cardiac medications before your next appointment, please call your pharmacy. 

## 2015-07-25 NOTE — Progress Notes (Signed)
Patient ID: MUHAMED LUECKE, male   DOB: 11/11/1960, 55 y.o.   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  Mr. Oryn Casanova has a history of CAD and developed recurrent episodes of chest discomfort associated with significant shortness of breath while at work on 06/03/2012.  He presented to [facility] and initial catheterization was done acutely by [name].  An attempted intervention to his subtotally occluded RCA was unsuccessful and the patient was  transported to [facility] and subsequently underwent successful intervention with placement of two 3.0x24 mm Promus premier drug-eluting stents. He was discharged on aspirin, atorvastatin 80 mg, and Lopressor but he develop significant issues with  fatigability. He saw the physician assistant on several occasions at [facility] and ultimately he was taken off his beta blocker therapy. An echo Doppler study on Jul 23, 2012  showed an ejection fraction of approximately 50% with inferior hypocontractility, as well as mild concentric LVH and grade 1 diastolic dysfunction. Several days later on Jul 28, 2012 due to  recurrent chest discomfort and T-wave changes he underwent repeat cardiac catheterization at [facility]. This showed patent stents in the proximal RCA. He did have a long tubular 30% stenosis in the mid LAD. Other vessels were normal. The patient had subsequently seen Dr. [name] and was referred to me for subsequent post-MI management and care.  When I initially saw him on August 19, 2012 I  recommended an initial trial of ACE inhibitor and started him on low-dose lisinopril. He could not tolerate this small dose and experienced some recurrent chest pain. An NMR  lipoprofile  revealed a calculated LDL of 41 with an LDL particle number of 711. HDL cholesterol was low at 34 and his HDL particle number remains low at 24.9. Triglycerides  are excellent at 80. Insulin resistance was slightly elevated at 52. Hemoglobin was 14.1 hematocrit 41.1 BUN 12 a 1.12. Fasting glucose mildly elevated at 123. I also performed a  P2Y12 assessment of platelet function since he was on Plavix and this revealed adequate platelet response with a value of 191. Vitamin D. level was normal at 50. TSH was 2.602.  A nuclear perfusion study on 05/18/2013 revealed good exercise capacity without chest pain or ECG changes was felt to be low risk with a small area of fixed inferobasal defect, suggesting of a small scar versus diaphragmatic attenuation.  There was no ischemia.  Bood work from 05/10/2013 done by Dr. [name]: Total cholesterol was 115, LDL cholesterol 54, HDL cholesterol, mildly low at 37, and triglycerides of 118.  When I saw him I was concerned about obstructive sleep apnea. His wife commented that he was experiencing very loud snoring.  He admitted that his sleep wa unbearable.  I strongly recommended a sleep evaluation but he has since still not pursued this.  He  developed an episode of chest pain while at work after he was extremely fatigued following a stomach virus which had resulted in significant diarrhea and vomiting leading to dehydration.  He was evaluated at [facility] where he was admitted overnight.  Enzymes were negative.  He subsequent underwent another nuclear perfusion study on 12/29/2013.  He exercised for 8 minutes and 30 seconds and achieved a 10 minute workload and peak heart rate of 1 48 bpm.  Scintigraphic images again showed a small to medium-sized mild to  moderate intensity fixed perfusion defect in the basal to mid inferior wall consistent with his known prior inferior MI.  Last year he was evaluated in the emergency room with a different chest pain that was more sharp.  Troponin was negative.  He was given a dose of isosorbide 30 mg but has not been on this regularly.  He denies recurrent chest  pain of any similar characteristic to his prior heart attack.  He has been working outside over the summer and heat has led to significant fatigue.  He is unaware of palpitations.  Since I last saw him, he admits to being under significant increased stress.  His mother is in assisted living with severe Alzheimer's disease and does not even recognize him.  His father is had recurrent issues of heart failure.  His son is had neurologic issues and is undergoing neurologic evaluation.  His wife had worked for Dr. Hardin Negus for many years who recently passed away.  At times he notes vague sharp discomfort.  He denies any the previous chest pain.  She felt at the time of his MI.  He is unaware of palpitations.  He admits to significant fatigue.  He recently established with Dr. Brynda Greathouse for primary care after Dr. [name] death.  Laboratory was done recently by Dr. Brynda Greathouse.  He presents for evaluation.    Past Medical History  Diagnosis Date  . Coronary artery disease 05/2012    MI tx. @ [facility] 2 stents placed   . MI, old   . Hypercholesteremia   . GERD (gastroesophageal reflux disease)   . Bone spur of left foot     Heel  . Diverticulitis   . Anxiety     on chlorazepate, no preventative  . S/P cardiac cath 07/28/12    Normal L main, Small ramus, L circumflex: small OM1, large branching OM2, small OM3; LAD: Tiny D1, tiny D2, Tiny D3, long tubular 30% stenosis in mid LAD; RCA: patent stents in proximal RCA, normal PDA, 2 small posterolateral branches  . LV dysfunction 07/26/12    EF 50%  . Mild pulmonic regurgitation and RV dysfunction by prior echocardiogram 07/26/12  . Laryngopharyngeal reflux (LPR)     Past Surgical History  Procedure Laterality Date  . Rt. acl surgery  1993    [facility]  . Appendectomy  1972  . Cardiac surgery    . Cardiac catheterization      3/14(x2), 5/14  . Colonoscopy with propofol N/A 04/28/2015    Procedure: COLONOSCOPY WITH PROPOFOL;  Surgeon: Lucilla Lame, MD;   Location: [place];  Service: Endoscopy;  Laterality: N/A;  . Polypectomy  04/28/2015    Procedure: POLYPECTOMY;  Surgeon: Lucilla Lame, MD;  Location: [place];  Service: Endoscopy;;    Allergies  Allergen Reactions  . Lisinopril Other (See Comments)    Severe chest pain and fatigue  . Lipitor [Atorvastatin] Other (See Comments)    Myalgias     Current Outpatient Prescriptions  Medication Sig Dispense Refill  . aspirin 81 MG tablet Take 81 mg by mouth daily.    Marland Kitchen atorvastatin (LIPITOR) 20 MG tablet Take 1 tablet (20 mg total) by mouth daily. 90 tablet 1  . clopidogrel (PLAVIX) 75 MG tablet Take 75 mg by mouth daily.    . clorazepate (TRANXENE) 7.5 MG tablet Take 7.5 mg by mouth 2 (two) times daily as needed for anxiety. prn    . metoprolol tartrate (LOPRESSOR) 25 MG tablet Take 12.5 mg  by mouth 2 (two) times daily.    . nitroGLYCERIN (NITROSTAT) 0.4 MG SL tablet Place 0.4 mg under the tongue every 5 (five) minutes as needed for chest pain.    . pantoprazole (PROTONIX) 40 MG tablet Take 1 tablet (40 mg total) by mouth daily. 30 tablet 3  . zolpidem (AMBIEN) 10 MG tablet      No current facility-administered medications for this visit.    Socially he is married. He works as a Oceanographer. He had smoked for 40 years but quit the day of his heart attack. Mostly, he did have a history of alcohol and drug use but he quit approximately 14 years ago.  Family History  Problem Relation Age of Onset  . Heart disease Father   . Heart attack Father   . Heart disease Brother   . Heart attack Brother   . Heart disease Paternal Grandfather   . Heart attack Mother     ROS General: Negative; No fevers, chills, or night sweats; Significant fatigue and increased stress HEENT: Negative; No changes in vision or hearing, sinus congestion, difficulty swallowing Pulmonary: Negative; No cough, wheezing, shortness of breath, hemoptysis Cardiovascular: see HPI GI: Negative; No  nausea, vomiting, diarrhea, or abdominal pain GU: Negative; No dysuria, hematuria, or difficulty voiding Musculoskeletal: Negative; no myalgias, joint pain, or weakness Hematologic/Oncology: Negative; no easy bruising, bleeding Endocrine: Negative; no heat/cold intolerance; no diabetes Neuro: Negative; no changes in balance, headaches Skin: Negative; No rashes or skin lesions Psychiatric: Negative; No behavioral problems, depression Sleep: Very poor sleep with loud snoring, no daytime sleepiness, hypersomnolence, bruxism, restless legs, hypnogognic hallucinations, no cataplexy Other comprehensive 14 point system review is negative.   PE BP 112/83 mmHg  Pulse 64  Ht '6\' 1"'  (1.854 m)  Wt 201 lb (91.173 kg)  BMI 26.52 kg/m2   Wt Readings from Last 3 Encounters:  07/25/15 201 lb (91.173 kg)  05/22/15 197 lb (89.359 kg)  04/28/15 196 lb (88.905 kg)   General: Alert, oriented, no distress.  HEENT: Normocephalic, atraumatic. Pupils round and reactive; sclera anicteric; Fundi no hemorrhages or exudates. Nose without nasal septal hypertrophy Mouth/Parynx benign; Mallinpatti scale 3 Neck: No JVD, no carotid bruits with normal carotid upstroke. Lungs: Decreased breath sounds diffusely; no wheezing or rales Chest wall: Nontender to palpation Heart: RRR, s1 s2 normal 1/6 systolic murmur; diastolic murmur.  No rubs thrills or heaves. Abdomen: soft, nontender; no hepatosplenomehaly, BS+; abdominal aorta nontender and not dilated by palpation. Pulses 2+  Back: No CVA tenderness Extremities: no clubbinbg cyanosis or edema, Homan's sign negative  Neurologic: grossly nonfocal Psychological: Normal affect and mood  ECG (independently read by me): Normal sinus rhythm at 64 bpm.  Borderline LVH by voltage criteria.  Old inferior MI with Q waves.  September 2016 ECG (independently read by me): Normal sinus rhythm at 67 bpm.  Q waves inferiorly, compatible with his old inferior MI.  No significant ST  changes.  May 2016 ECG (independently read by me): Normal sinus rhythm at 73 bpm.  Occasional unifocal PVCs.  November 2015 ECG (independently read by me): Sinus rhythm with PVC.  Old inferior by with Q waves in leads 3 and aVF.  Mild RV conduction delay.  Prior 11/20/2012 ECG: Sinus bradycardia at 57 beats per minute. Evidence for old inferior infarct with Q wave in 3.  T-wave inversion in 3 and F and also T-wave inversion V1 through V4.   BMP Latest Ref Rng 03/18/2015 10/29/2014 08/19/2014  Glucose 65 - 99  mg/dL 87 101(H) -  BUN 6 - 20 mg/dL '13 12 9  ' Creatinine 0.61 - 1.24 mg/dL 1.13 1.11 1.1  Sodium 135 - 145 mmol/L 138 138 140  Potassium 3.5 - 5.1 mmol/L 3.7 3.5 4.4  Chloride 101 - 111 mmol/L 106 108 -  CO2 22 - 32 mmol/L 26 24 -  Calcium 8.9 - 10.3 mg/dL 9.1 8.8(L) -   Hepatic Function Latest Ref Rng 03/18/2015 08/19/2014 08/20/2012  Total Protein 6.5 - 8.1 g/dL 6.8 - 6.7  Albumin 3.5 - 5.0 g/dL 4.1 - 3.9  AST 15 - 41 U/L '19 26 21  ' ALT 17 - 63 U/L 34 18 23  Alk Phosphatase 38 - 126 U/L 57 65 61  Total Bilirubin 0.3 - 1.2 mg/dL 1.0 - 0.6   CBC Latest Ref Rng 03/18/2015 10/29/2014 08/19/2014  WBC 3.8 - 10.6 K/uL 14.0(H) 7.2 7.6  Hemoglobin 13.0 - 18.0 g/dL 13.3 13.9 14.9  Hematocrit 40.0 - 52.0 % 39.1(L) 40.2 42  Platelets 150 - 440 K/uL 136(L) 142(L) 172   Lab Results  Component Value Date   TSH 2.602 08/20/2012  No results found for: HGBA1C  Lipid Panel     Component Value Date/Time   CHOL 102 08/19/2014   CHOL 100 12/29/2013 0450   TRIG 104 08/19/2014   TRIG 124 12/29/2013 0450   HDL 32* 08/19/2014   HDL 27* 12/29/2013 0450   VLDL 25 12/29/2013 0450   LDLCALC 49 08/19/2014   LDLCALC 48 12/29/2013 0450    RADIOLOGY: No results found.   ASSESSMENT AND PLAN:   Mr. 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 Primary intervention was difficult but ultimately successful at [facility] following a failed  attempt at [facility] with insertion of 2 DES Promus Premier  stents into his proximal RCA.  He has mild concomitant coronary artery disease with smooth tubular narrowing of his middle LAD of 30%.  He did not tolerate even a very low dose of  ACE inhibition and this may have been due to hypotension particularly in the setting of extreme heat and humidity while working.  He continues to be on dual antiplatelet therapy with aspirin and Plavix.  His last nuclear perfusion study done at [place] was unchanged and again showed a small to medium size mild to moderate intensity fixed perfusion defect in the basal to midinferior wall consistent with his known prior inferior MI without associated ischemia.  He has experienced some vague sharp chest discomfort.  Most recently, he is significantly stressed with his mother's mental capacity, father's illness, as well as his sons.  He admits to significant fatigue.  He is sleeping approximate 6-7 hours of sleep per night.  He was told that his lab work was stable when recently checked by Dr. Brynda Greathouse, but his triglycerides had risen slightly.  Last June.  His triglycerides are 104 with a total cholesterol 102, and LDL 49.  I will try to obtain the results of the blood work by Dr. Brynda Greathouse.  His blood pressure today is stable on metoprolol, tartrate 12.5 mg twice a day.  He takes pantoprazole for GERD, which is controlled.  He denies bleeding and continues to be on dual platelet therapy with aspirin and Plavix.  His lipids have been controlled with atorvastatin 20 mg.  I recommended he continue his current medical regimen.  He will return in 6 months for reevaluation. Time spent: 25 minutes  Troy Sine,  MD, [facility] 07/25/2015 6:08 PM

## 2015-11-22 ENCOUNTER — Ambulatory Visit: Payer: 59 | Admitting: Cardiovascular Disease

## 2016-05-23 ENCOUNTER — Ambulatory Visit (INDEPENDENT_AMBULATORY_CARE_PROVIDER_SITE_OTHER): Payer: 59 | Admitting: Cardiovascular Disease

## 2016-05-23 ENCOUNTER — Encounter: Payer: Self-pay | Admitting: Cardiovascular Disease

## 2016-05-23 VITALS — BP 115/82 | HR 53 | Ht 73.0 in | Wt 217.6 lb

## 2016-05-23 DIAGNOSIS — R0789 Other chest pain: Secondary | ICD-10-CM

## 2016-05-23 DIAGNOSIS — Z79899 Other long term (current) drug therapy: Secondary | ICD-10-CM | POA: Diagnosis not present

## 2016-05-23 DIAGNOSIS — I2510 Atherosclerotic heart disease of native coronary artery without angina pectoris: Secondary | ICD-10-CM | POA: Diagnosis not present

## 2016-05-23 DIAGNOSIS — R001 Bradycardia, unspecified: Secondary | ICD-10-CM

## 2016-05-23 DIAGNOSIS — E785 Hyperlipidemia, unspecified: Secondary | ICD-10-CM | POA: Diagnosis not present

## 2016-05-23 DIAGNOSIS — I252 Old myocardial infarction: Secondary | ICD-10-CM | POA: Diagnosis not present

## 2016-05-23 DIAGNOSIS — I2583 Coronary atherosclerosis due to lipid rich plaque: Secondary | ICD-10-CM | POA: Diagnosis not present

## 2016-05-23 NOTE — Patient Instructions (Signed)
Your physician has recommended you make the following change in your medication:   1.) PURCHASE [id number] 200- 300 mg over the counter. This will help with the muscle pain.  Your physician recommends that you return for lab work fasting.  Your physician wants you to follow-up in: 6 months or sooner if needed. You will receive a reminder letter in the mail two months in advance. If you don't receive a letter, please call our office to schedule the follow-up appointment.

## 2016-05-23 NOTE — Progress Notes (Signed)
Patient ID: Aaron Kelley, male   DOB: 12-02-1960, 56 y.o.   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  Aaron Kelley has a history of CAD and developed recurrent episodes of chest discomfort associated with significant shortness of breath while at work on 06/03/2012.  He presented to [facility] and initial catheterization was done acutely by [name].  An attempted intervention to his subtotally occluded RCA was unsuccessful and the patient was  transported to [facility] and subsequently underwent successful intervention with placement of two 3.0x24 mm Promus premier drug-eluting stents. He was discharged on aspirin, atorvastatin 80 mg, and Lopressor but he develop significant issues with  fatigability. He saw the physician assistant on several occasions at [facility] and ultimately he was taken off his beta blocker therapy. An echo Doppler study on Jul 23, 2012  showed an ejection fraction of approximately 50% with inferior hypocontractility, as well as mild concentric LVH and grade 1 diastolic dysfunction. Several days later on Jul 28, 2012 due to  recurrent chest discomfort and T-wave changes he underwent repeat cardiac catheterization at [facility]. This showed patent stents in the proximal RCA. He did have a long tubular 30% stenosis in the mid LAD. Other vessels were normal. The patient had subsequently seen Dr. [name] and was referred to me for subsequent post-MI management and care.  When I initially saw him on August 19, 2012 I  recommended an initial trial of ACE inhibitor and started him on low-dose lisinopril. He could not tolerate this small dose and experienced some recurrent chest pain. An NMR  lipoprofile  revealed a calculated LDL of 41 with an LDL particle number of 711. HDL cholesterol was low at 34 and his HDL particle number remains low at 24.9. Triglycerides  are excellent at 80. Insulin resistance was slightly elevated at 52. Hemoglobin was 14.1 hematocrit 41.1 BUN 12 a 1.12. Fasting glucose mildly elevated at 123. I also performed a  P2Y12 assessment of platelet function since he was on Plavix and this revealed adequate platelet response with a value of 191. Vitamin D. level was normal at 50. TSH was 2.602.  A nuclear perfusion study on 05/18/2013 revealed good exercise capacity without chest pain or ECG changes was felt to be low risk with a small area of fixed inferobasal defect, suggesting of a small scar versus diaphragmatic attenuation.  There was no ischemia.  Bood work from 05/10/2013 done by Dr. [name]: Total cholesterol was 115, LDL cholesterol 54, HDL cholesterol, mildly low at 37, and triglycerides of 118.  When I saw him I was concerned about obstructive sleep apnea. His wife commented that he was experiencing very loud snoring.  He admitted that his sleep wa unbearable.  I strongly recommended a sleep evaluation but he has since still not pursued this.  He  developed an episode of chest pain while at work after he was extremely fatigued following a stomach virus which had resulted in significant diarrhea and vomiting leading to dehydration.  He was evaluated at [facility] where he was admitted overnight.  Enzymes were negative.  He subsequent underwent another nuclear perfusion study on 12/29/2013.  He exercised for 8 minutes and 30 seconds and achieved a 10 minute workload and peak heart rate of 148 bpm.  Scintigraphic images again showed a small to medium-sized mild to moderate  intensity fixed perfusion defect in the basal to mid inferior wall consistent with his known prior inferior MI.  Last year he was evaluated in the emergency room with a different chest pain that was more sharp.  Troponin was negative.  He was given a dose of isosorbide 30 mg but has not been on this regularly.  He denies recurrent chest pain  of any similar characteristic to his prior heart attack.  He has been working outside over the summer and heat has led to significant fatigue.  He is unaware of palpitations.  Since I last saw him, he has continued to be under increased stress.  His mother had passed away.  In addition, his father died in Mar 08, 2016.  His 12 year old son has developed a large tumor on his sciatic nerve and has undergone numerous neurologic evaluations and is now referred to the [facility].  He denies any major episodes of chest pressure but at times notes twinges of some chest pain.  The past, he could not tolerate higher doses of atorvastatin and recently has been taking atorvastatin 20 mg but occasionally notes some foot myalgias myalgias previously in his thigh.  Unaware of palpitations.  He has not had recent blood work.  He presents for evaluation.  Past Medical History:  Diagnosis Date  . Anxiety    on chlorazepate, no preventative  . Bone spur of left foot    Heel  . Coronary artery disease 05/2012   MI tx. @ [facility] 2 stents placed   . Diverticulitis   . GERD (gastroesophageal reflux disease)   . Hypercholesteremia   . Laryngopharyngeal reflux (LPR)   . LV dysfunction 07/26/12   EF 50%  . MI, old   . Mild pulmonic regurgitation and RV dysfunction by prior echocardiogram 07/26/12  . S/P cardiac cath 07/28/12   Normal L main, Small ramus, L circumflex: small OM1, large branching OM2, small OM3; LAD: Tiny D1, tiny D2, Tiny D3, long tubular 30% stenosis in mid LAD; RCA: patent stents in proximal RCA, normal PDA, 2 small posterolateral branches    Past Surgical History:  Procedure Laterality Date  . APPENDECTOMY  1972  . CARDIAC CATHETERIZATION     3/14(x2), 5/14  . CARDIAC SURGERY    . COLONOSCOPY WITH PROPOFOL N/A 04/28/2015   Procedure: COLONOSCOPY WITH PROPOFOL;  Surgeon: Lucilla Lame, MD;  Location: [place];  Service: Endoscopy;  Laterality: N/A;  . POLYPECTOMY  04/28/2015   Procedure:  POLYPECTOMY;  Surgeon: Lucilla Lame, MD;  Location: [place];  Service: Endoscopy;;  . rt. ACL surgery  1993   [facility]    Allergies  Allergen Reactions  . Lisinopril Other (See Comments)    Severe chest pain and fatigue  . Lipitor [Atorvastatin] Other (See Comments)    Myalgias     Current Outpatient Prescriptions  Medication Sig Dispense Refill  . aspirin 81 MG tablet Take 81 mg by mouth daily.    Marland Kitchen atorvastatin (LIPITOR) 20 MG tablet Take 1 tablet (20 mg total) by mouth daily. 90 tablet 1  . clopidogrel (PLAVIX) 75 MG tablet Take 75 mg by mouth daily.    . clorazepate (TRANXENE) 7.5 MG tablet Take 7.5 mg by mouth 2 (two) times daily as needed for anxiety. prn    . metoprolol tartrate (LOPRESSOR) 25 MG tablet Take 25 mg by mouth daily.    . nitroGLYCERIN (NITROSTAT) 0.4 MG SL tablet Place 0.4 mg under the tongue every 5 (five) minutes as  needed for chest pain.    . pantoprazole (PROTONIX) 40 MG tablet Take 1 tablet (40 mg total) by mouth daily. 30 tablet 3  . zolpidem (AMBIEN) 10 MG tablet Take 10 mg by mouth at bedtime as needed.      No current facility-administered medications for this visit.     Socially he is married. He works as a Oceanographer. He had smoked for 40 years but quit the day of his heart attack. Mostly, he did have a history of alcohol and drug use but he quit approximately 14 years ago.  Family History  Problem Relation Age of Onset  . Heart disease Father   . Heart attack Father   . Heart disease Brother   . Heart attack Brother   . Heart disease Paternal Grandfather   . Heart attack Mother     ROS General: Negative; No fevers, chills, or night sweats; Significant fatigue and increased stress HEENT: Negative; No changes in vision or hearing, sinus congestion, difficulty swallowing Pulmonary: Negative; No cough, wheezing, shortness of breath, hemoptysis Cardiovascular: see HPI GI: Negative; No nausea, vomiting, diarrhea, or abdominal  pain GU: Negative; No dysuria, hematuria, or difficulty voiding Musculoskeletal: Negative; no myalgias, joint pain, or weakness Hematologic/Oncology: Negative; no easy bruising, bleeding Endocrine: Negative; no heat/cold intolerance; no diabetes Neuro: Negative; no changes in balance, headaches Skin: Negative; No rashes or skin lesions Psychiatric: Negative; No behavioral problems, depression Sleep: Very poor sleep with loud snoring, no daytime sleepiness, hypersomnolence, bruxism, restless legs, hypnogognic hallucinations, no cataplexy Other comprehensive 14 point system review is negative.   PE BP 115/82   Pulse (!) 53   Ht '6\' 1"'  (1.854 m)   Wt 217 lb 9.6 oz (98.7 kg)   BMI 28.71 kg/m     Repeat blood pressure by me was 120/70  Wt Readings from Last 3 Encounters:  05/23/16 217 lb 9.6 oz (98.7 kg)  07/25/15 201 lb (91.2 kg)  05/22/15 197 lb (89.4 kg)   General: Alert, oriented, no distress.  HEENT: Normocephalic, atraumatic. Pupils round and reactive; sclera anicteric; Fundi no hemorrhages or exudates. Nose without nasal septal hypertrophy Mouth/Parynx benign; Mallinpatti scale 3 Neck: No JVD, no carotid bruits with normal carotid upstroke. Lungs: Decreased breath sounds diffusely; no wheezing or rales Chest wall: Nontender to palpation Heart: RRR, s1 s2 normal 1/6 systolic murmur; diastolic murmur.  No rubs thrills or heaves. Abdomen: soft, nontender; no hepatosplenomehaly, BS+; abdominal aorta nontender and not dilated by palpation. Pulses 2+  Back: No CVA tenderness Extremities: no clubbinbg cyanosis or edema, Homan's sign negative  Neurologic: grossly nonfocal Psychological: Normal affect and mood  ECG (independently read by me): Sinus bradycardia 53 bpm.  Old inferior Q waves 3 and aVF.  Normal intervals.  No ST segment changes.  May 2017 ECG (independently read by me): Normal sinus rhythm at 64 bpm.  Borderline LVH by voltage criteria.  Old inferior MI with Q  waves.  September 2016 ECG (independently read by me): Normal sinus rhythm at 67 bpm.  Q waves inferiorly, compatible with his old inferior MI.  No significant ST changes.  May 2016 ECG (independently read by me): Normal sinus rhythm at 73 bpm.  Occasional unifocal PVCs.  November 2015 ECG (independently read by me): Sinus rhythm with PVC.  Old inferior by with Q waves in leads 3 and aVF.  Mild RV conduction delay.  Prior 11/20/2012 ECG: Sinus bradycardia at 57 beats per minute. Evidence for old inferior infarct with Q wave in  3.  T-wave inversion in 3 and F and also T-wave inversion V1 through V4.   BMP Latest Ref Rng & Units 03/18/2015 10/29/2014 08/19/2014  Glucose 65 - 99 mg/dL 87 101(H) -  BUN 6 - 20 mg/dL '13 12 9  ' Creatinine 0.61 - 1.24 mg/dL 1.13 1.11 1.1  Sodium 135 - 145 mmol/L 138 138 140  Potassium 3.5 - 5.1 mmol/L 3.7 3.5 4.4  Chloride 101 - 111 mmol/L 106 108 -  CO2 22 - 32 mmol/L 26 24 -  Calcium 8.9 - 10.3 mg/dL 9.1 8.8(L) -   Hepatic Function Latest Ref Rng & Units 03/18/2015 08/19/2014 08/20/2012  Total Protein 6.5 - 8.1 g/dL 6.8 - 6.7  Albumin 3.5 - 5.0 g/dL 4.1 - 3.9  AST 15 - 41 U/L '19 26 21  ' ALT 17 - 63 U/L 34 18 23  Alk Phosphatase 38 - 126 U/L 57 65 61  Total Bilirubin 0.3 - 1.2 mg/dL 1.0 - 0.6   CBC Latest Ref Rng & Units 03/18/2015 10/29/2014 08/19/2014  WBC 3.8 - 10.6 K/uL 14.0(H) 7.2 7.6  Hemoglobin 13.0 - 18.0 g/dL 13.3 13.9 14.9  Hematocrit 40.0 - 52.0 % 39.1(L) 40.2 42  Platelets 150 - 440 K/uL 136(L) 142(L) 172   Lab Results  Component Value Date   TSH 2.602 08/20/2012  No results found for: HGBA1C  Lipid Panel     Component Value Date/Time   CHOL 102 08/19/2014   CHOL 100 12/29/2013 0450   TRIG 104 08/19/2014   TRIG 124 12/29/2013 0450   HDL 32 (A) 08/19/2014   HDL 27 (L) 12/29/2013 0450   VLDL 25 12/29/2013 0450   LDLCALC 49 08/19/2014   LDLCALC 48 12/29/2013 0450    RADIOLOGY: No results found.  IMPRESSION:  1. Coronary artery disease  due to lipid rich plaque   2. Old MI (myocardial infarction)   3. Hyperlipidemia with target LDL less than 70   4. Atypical chest pain   5. Sinus bradycardia   6. Medication management     ASSESSMENT AND PLAN:   Mr. Rosenberry is a 56 year old white male who suffered an inferior wall myocardial infarction on 06/03/2012. Primary intervention was difficult but ultimately successful at [facility] following a failed attempt at [facility] with insertion of 2 DES Promus Premier  stents into his proximal RCA.  He has mild concomitant coronary artery disease with smooth tubular narrowing of his middle LAD of 30%.  He did not tolerate even a very low dose of  ACE inhibition and this may have been due to hypotension particularly in the setting of extreme heat and humidity while working.  He continues to be on dual antiplatelet therapy with aspirin and Plavix.  His last nuclear perfusion study done at [facility] was unchanged and showed a small to medium size mild to moderate intensity fixed perfusion defect in the basal to midinferior wall consistent with his known prior inferior MI without associated ischemia.  Over the past 2 years.  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  He will be referred to the [facility].  He was wondering about discomfort being due to myalgias from atorvastatin.  I have suggested he continue the present dose of 20 mg and advised  the addition of coenzyme Q 10 at 200 or 300 mg daily.  Complete set of blood work will be obtained and medication adjustments will be done if necessary.  I have recommended he continue dual antiplatelet therapy.  He is mildly bradycardic but is tolerating low-dose metoprolol 25 mg.  His GERD is  controlled with Protonix.  I congratulated him on the birth of his grandson one month ago.  I will see him in 6 months for reevaluation.  Time spent: 25 minutes  Troy Sine, MD, [facility] 05/23/2016 1:06 PM

## 2016-05-31 ENCOUNTER — Telehealth: Payer: Self-pay | Admitting: Cardiovascular Disease

## 2016-05-31 NOTE — Telephone Encounter (Signed)
Spoke patient  He would like result of labs  RN informed patient result have not been reviewed by Dr Claiborne Billings, preliminary results are in normal limits except glucose level -elevated, CHOLESTEROL HDL - low.  patient aware when final results are reviewed by Dr Claiborne Billings , someone will contact him back.

## 2016-05-31 NOTE — Telephone Encounter (Signed)
Pt called needs blood work results.   Please give him a call back.

## 2016-06-06 ENCOUNTER — Encounter: Payer: Self-pay | Admitting: Cardiovascular Disease

## 2016-06-18 ENCOUNTER — Telehealth: Payer: Self-pay | Admitting: Cardiovascular Disease

## 2016-06-18 NOTE — Telephone Encounter (Signed)
Flonase OTC nasal spray 1 spray into each nostril twice daily (is mainly allergic rhinitis)  OR  Zyrtec or Cetirizine 10mg  (NOT "D" formulation; avoid zyrtec-D) at bedtime .  Use daily for 3-5 days, then as needed for allergies

## 2016-06-18 NOTE — Telephone Encounter (Signed)
New message      What can pt take over-the-counter for allergies?

## 2016-06-18 NOTE — Telephone Encounter (Signed)
Pt notified he will try and let us know if there is anything else that he may need

## 2016-07-18 IMAGING — CT CT CTA ABD/PEL W/CM AND/OR W/O CM
2 of 3 series · 13 of 32 positions shown, 18 images · IV contrast (APPLIED)
Comparison: None.

CLINICAL DATA: Lower abdominal pain radiating down left leg for 1
day. Febrile. Leukocytosis.

EXAM:
CTA ABDOMEN AND PELVIS wITHOUT AND WITH CONTRAST
TECHNIQUE: Multidetector CT imaging of the abdomen and pelvis was performed
using the standard protocol during bolus administration of
intravenous contrast. Multiplanar reconstructed images and MIPs were
obtained and reviewed to evaluate the vascular anatomy.
CONTRAST:  100mL OMNIPAQUE IOHEXOL 350 MG/ML SOLN

[Series 4: axial venous · axial · portal-venous · 0.83mm/px · z∈[-516,-126]mm · 7 of 106 slices shown, 12 images]
[im 14/106  soft-tissue]
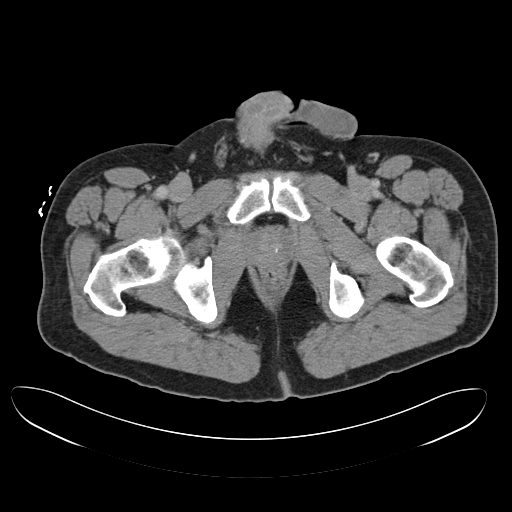
[im 14/106  bone]
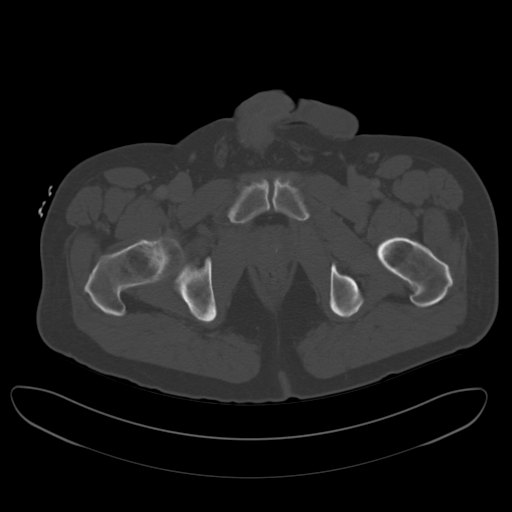
[im 27/106  soft-tissue]
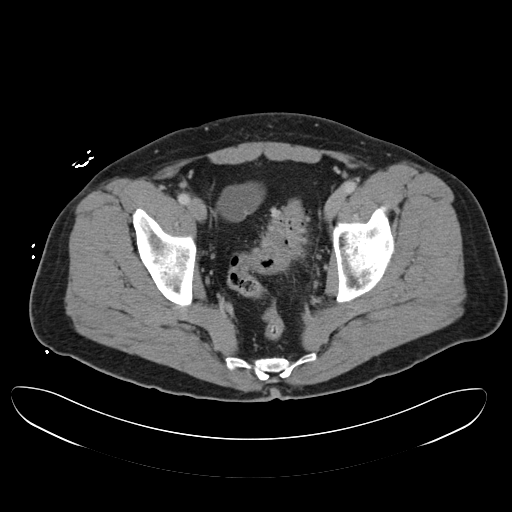
[im 40/106  soft-tissue]
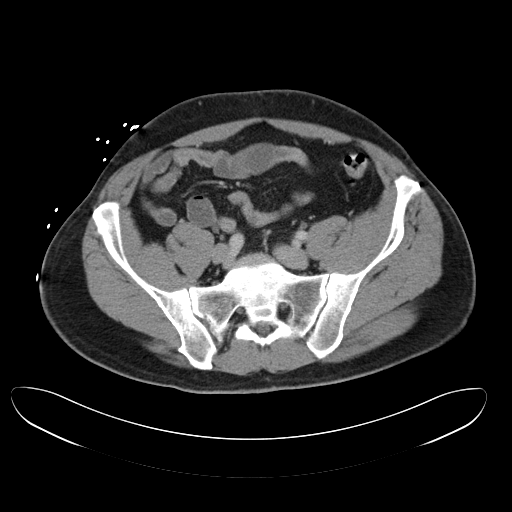
[im 53/106  soft-tissue]
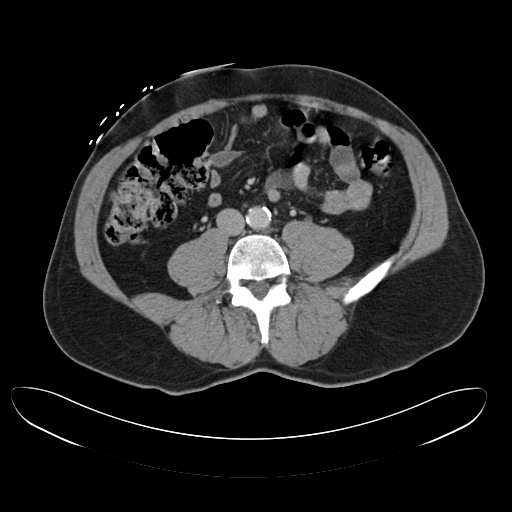
[im 53/106  lung]
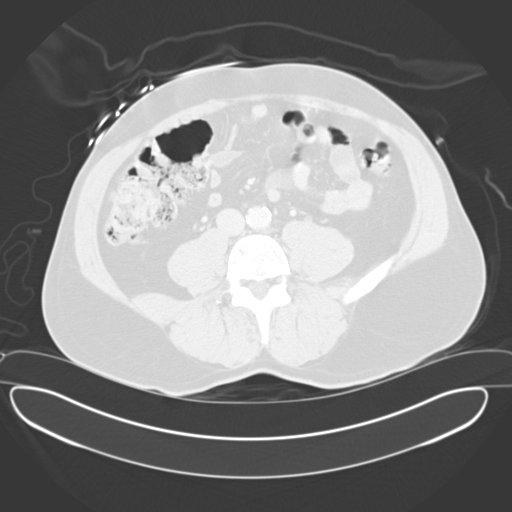
[im 66/106  soft-tissue]
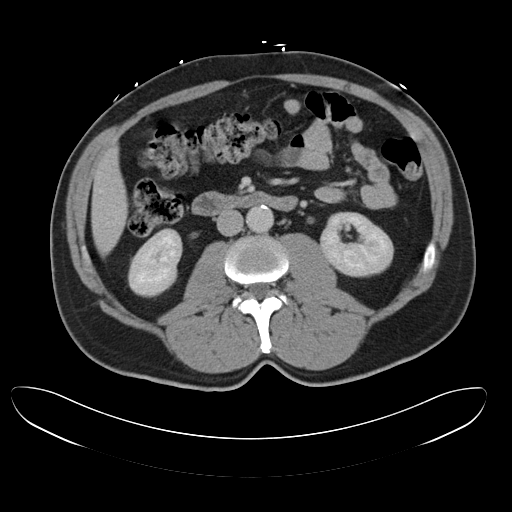
[im 66/106  lung]
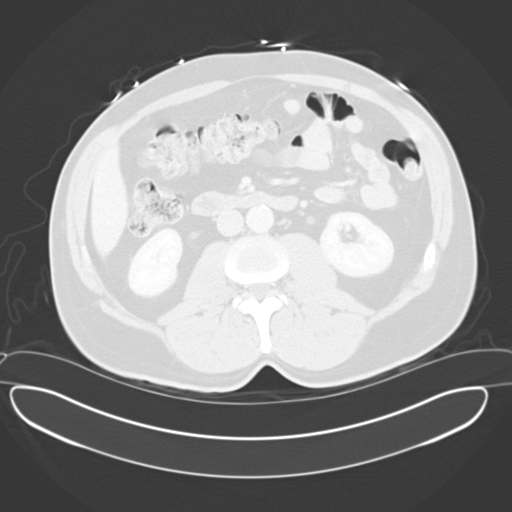
[im 79/106  soft-tissue]
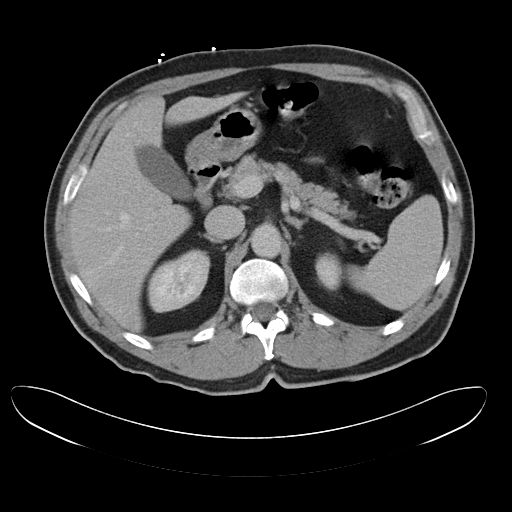
[im 79/106  lung]
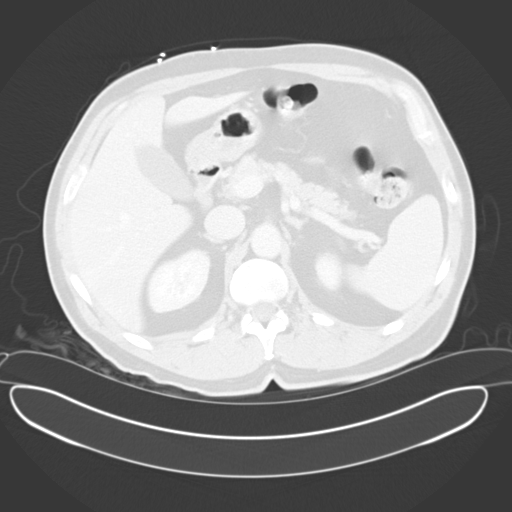
[im 92/106  soft-tissue]
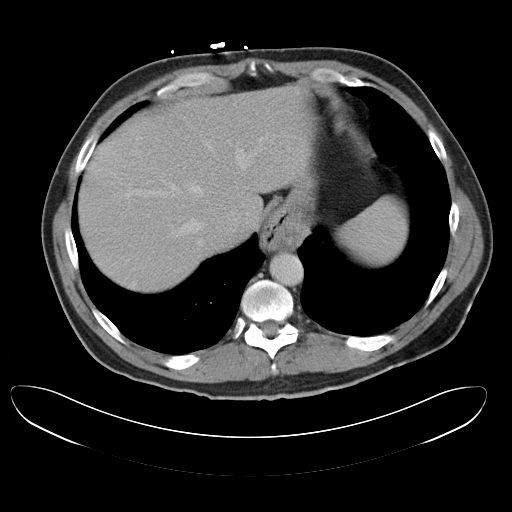
[im 92/106  lung]
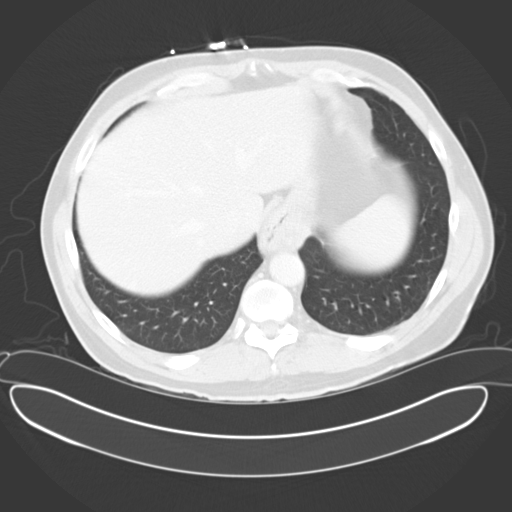

[Series 5: axial arterial · axial · arterial · 0.83mm/px · z∈[-532,-216]mm · 6 of 265 slices shown]
[im 27/265  soft-tissue]
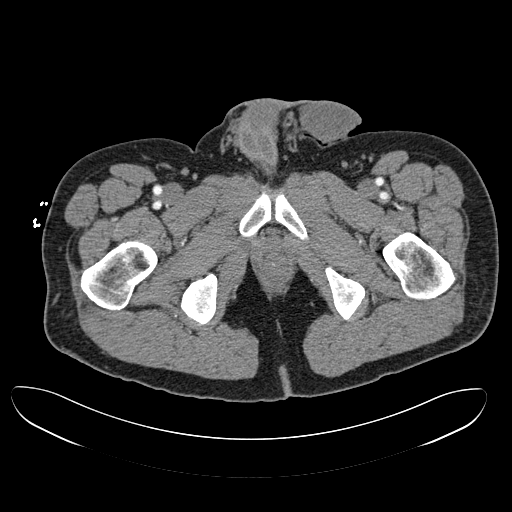
[im 53/265  soft-tissue]
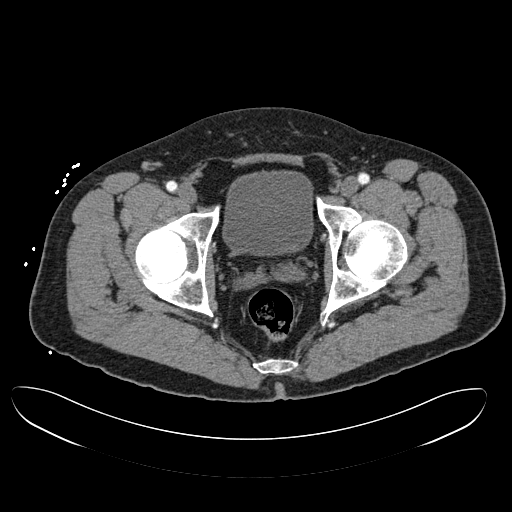
[im 80/265  soft-tissue]
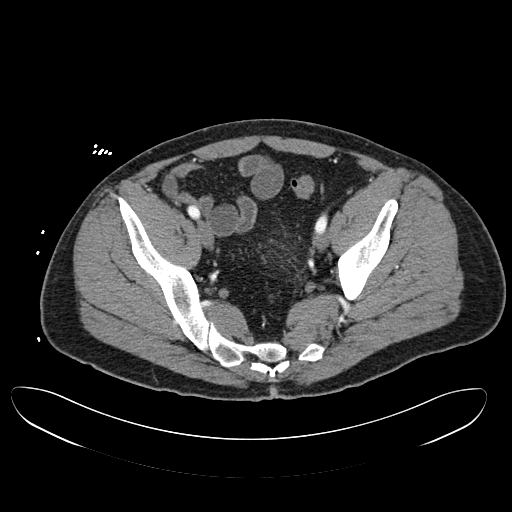
[im 119/265  soft-tissue]
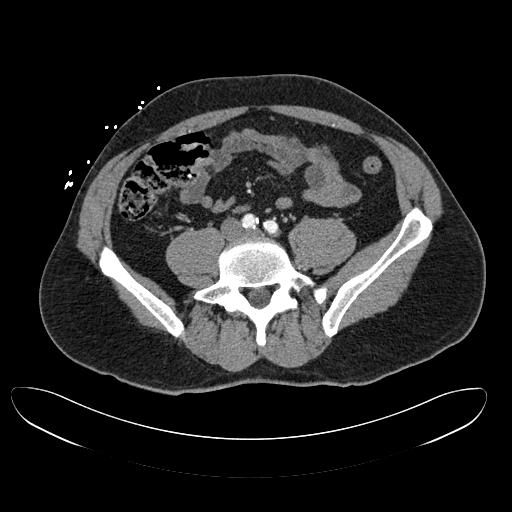
[im 146/265  soft-tissue]
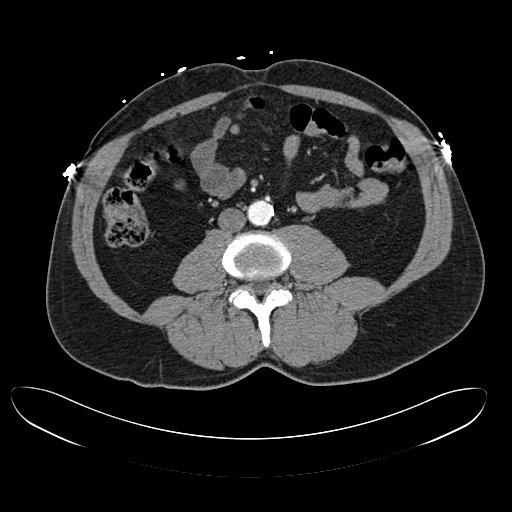
[im 185/265  soft-tissue]
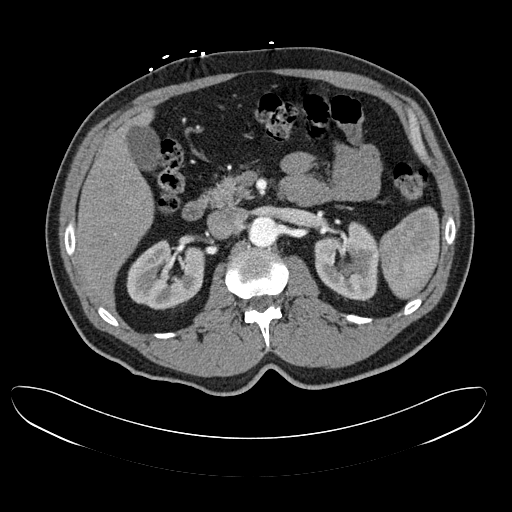

[13 of 32 positions shown; findings below may reference images not displayed]

FINDINGS: Lower chest: 3 mm right lower lobe pulmonary nodule on image
1/series 13. Mild cardiomegaly. Right Coronary artery
atherosclerosis. No pericardial or pleural effusion. A small to
moderate hiatal hernia. Eccentric left wall thickening of the distal
esophagus and herniated stomach, including on image 31/series 5.

Hepatobiliary: Normal liver. Gallstone or stones without surrounding
inflammation or biliary duct dilatation.

Pancreas: Normal, without mass or ductal dilatation.

Spleen: Normal in size, without focal abnormality.

Adrenals/Urinary Tract: Normal adrenal glands. Normal kidneys,
without hydronephrosis. Normal urinary bladder.

Stomach/Bowel: Normal remainder of the stomach. Sigmoid
diverticulosis with wall thickening and surrounding edema. Example
image 197/series 5. No surrounding abscess. No free perforation.
Normal terminal ileum. Probable appendectomy. Normal small bowel.

Vascular/Lymphatic: Accessory right upper pole renal artery. Mild
atherosclerosis involving the aorta and branch vessels. No aneurysm
or dissection. Moderate atherosclerosis involving the pelvic
vasculature. Both internal iliac arteries are patent.

No abdominopelvic adenopathy.

Reproductive: Normal prostate.

Other: No significant free fluid. Periumbilical fat containing tiny
ventral hernia.

Musculoskeletal: No acute osseous abnormality. Moderate disc bulge
at the lumbosacral junction.

Review of the MIP images confirms the above findings.
IMPRESSION: 1. Atherosclerosis, including within the coronary arteries. No
evidence of aortic dissection or aneurysm.
2. Non complicated sigmoid diverticulitis.
3. Small to moderate hiatal hernia. Wall thickening within the
distal esophagus and proximal stomach is eccentric. Cannot exclude
gastritis/esophagitis or neoplasia. Consider nonemergent endoscopy.
4. tiny right lung base nodule. If the patient is at high risk for
bronchogenic carcinoma, follow-up chest CT at 1 year is recommended.
If the patient is at low risk, no follow-up is needed. This
recommendation follows the consensus statement: "Guidelines for
Management of Small Pulmonary Nodules Detected on CT Scans: A
Statement from the [HOSPITAL]" as published in Radiology
7990; [DATE]. Available online at:
[URL]
5. Cholelithiasis.

## 2016-12-31 ENCOUNTER — Encounter: Payer: Self-pay | Admitting: Cardiovascular Disease

## 2016-12-31 ENCOUNTER — Ambulatory Visit (INDEPENDENT_AMBULATORY_CARE_PROVIDER_SITE_OTHER): Payer: 59 | Admitting: Cardiovascular Disease

## 2016-12-31 VITALS — BP 120/80 | HR 59 | Ht 73.0 in | Wt 217.0 lb

## 2016-12-31 DIAGNOSIS — Z79899 Other long term (current) drug therapy: Secondary | ICD-10-CM

## 2016-12-31 DIAGNOSIS — E785 Hyperlipidemia, unspecified: Secondary | ICD-10-CM | POA: Diagnosis not present

## 2016-12-31 DIAGNOSIS — I2583 Coronary atherosclerosis due to lipid rich plaque: Secondary | ICD-10-CM

## 2016-12-31 DIAGNOSIS — R0789 Other chest pain: Secondary | ICD-10-CM

## 2016-12-31 DIAGNOSIS — I2510 Atherosclerotic heart disease of native coronary artery without angina pectoris: Secondary | ICD-10-CM

## 2016-12-31 DIAGNOSIS — R5382 Chronic fatigue, unspecified: Secondary | ICD-10-CM | POA: Diagnosis not present

## 2016-12-31 DIAGNOSIS — I252 Old myocardial infarction: Secondary | ICD-10-CM

## 2016-12-31 NOTE — Progress Notes (Signed)
Patient ID: Aaron Kelley, male   DOB: 1960-12-15, 56 y.o.   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  Mr. Ziyon Soltau has a history of CAD and developed recurrent episodes of chest discomfort associated with significant shortness of breath while at work on 06/03/2012.  He presented to [facility] and initial catheterization was done acutely by [name].  An attempted intervention to his subtotally occluded RCA was unsuccessful and the patient was  transported to [facility] and subsequently underwent successful intervention with placement of two 3.0x24 mm Promus premier drug-eluting stents. He was discharged on aspirin, atorvastatin 80 mg, and Lopressor but he develop significant issues with  fatigability. He saw the physician assistant on several occasions at [facility] and ultimately he was taken off his beta blocker therapy. An echo Doppler study on Jul 23, 2012  showed an ejection fraction of approximately 50% with inferior hypocontractility, as well as mild concentric LVH and grade 1 diastolic dysfunction. Several days later on Jul 28, 2012 due to  recurrent chest discomfort and T-wave changes he underwent repeat cardiac catheterization at [facility]. This showed patent stents in the proximal RCA. He did have a long tubular 30% stenosis in the mid LAD. Other vessels were normal. The patient had subsequently seen Dr. [name] and was referred to me for subsequent post-MI management and care.  When I initially saw him on August 19, 2012 I  recommended an initial trial of ACE inhibitor and started him on low-dose lisinopril. He could not tolerate this small dose and experienced some recurrent chest pain. An NMR  lipoprofile  revealed a calculated LDL of 41 with an LDL particle number of 711. HDL cholesterol was low at 34 and his HDL particle number remains low at 24.9. Triglycerides  are excellent at 80. Insulin resistance was slightly elevated at 52. Hemoglobin was 14.1 hematocrit 41.1 BUN 12 a 1.12. Fasting glucose mildly elevated at 123. I also performed a  P2Y12 assessment of platelet function since he was on Plavix and this revealed adequate platelet response with a value of 191. Vitamin D. level was normal at 50. TSH was 2.602.  A nuclear perfusion study on 05/18/2013 revealed good exercise capacity without chest pain or ECG changes was felt to be low risk with a small area of fixed inferobasal defect, suggesting of a small scar versus diaphragmatic attenuation.  There was no ischemia.  Bood work from 05/10/2013 done by Dr. [name]: Total cholesterol was 115, LDL cholesterol 54, HDL cholesterol, mildly low at 37, and triglycerides of 118.  When I saw him I was concerned about obstructive sleep apnea. His wife commented that he was experiencing very loud snoring.  He admitted that his sleep wa unbearable.  I strongly recommended a sleep evaluation but he has since still not pursued this.  He  developed an episode of chest pain while at work after he was extremely fatigued following a stomach virus which had resulted in significant diarrhea and vomiting leading to dehydration.  He was evaluated at [facility] where he was admitted overnight.  Enzymes were negative.  He subsequent underwent another nuclear perfusion study on 12/29/2013.  He exercised for 8 minutes and 30 seconds and achieved a 10 minute workload and peak heart rate of 148 bpm.  Scintigraphic images again showed a small to medium-sized mild to moderate  intensity fixed perfusion defect in the basal to mid inferior wall consistent with his known prior inferior MI.  Last year he was evaluated in the emergency room with a different chest pain that was more sharp.  Troponin was negative.  He was given a dose of isosorbide 30 mg but has not been on this regularly.  He denies recurrent chest pain  of any similar characteristic to his prior heart attack.  He has been working outside over the summer and heat has led to significant fatigue.  He is unaware of palpitations.  He has continued to be under increased stress.  His mother had passed away.  In addition, his father died in 03-15-2016.  His 60 year old son has developed a large tumor on his sciatic nerve and has undergone numerous neurologic evaluations and was referred to the [facility].    Since I last saw him, he states proximal 26 weeks ago he had experienced an episode of chest pain.  This was different.  However, from his prior MI pain.  He had been working all day and notices sensation.  When he got home from work.  He tried a nitroglycerin and in about 10-15 minutes.  His symptoms improved.  He denies any exertional type symptomatology.  He presents for evaluation.  Past Medical History:  Diagnosis Date  . Anxiety    on chlorazepate, no preventative  . Bone spur of left foot    Heel  . Coronary artery disease 05/2012   MI tx. @ [facility] 2 stents placed   . Diverticulitis   . GERD (gastroesophageal reflux disease)   . Hypercholesteremia   . Laryngopharyngeal reflux (LPR)   . LV dysfunction 07/26/12   EF 50%  . MI, old   . Mild pulmonic regurgitation and RV dysfunction by prior echocardiogram 07/26/12  . S/P cardiac cath 07/28/12   Normal L main, Small ramus, L circumflex: small OM1, large branching OM2, small OM3; LAD: Tiny D1, tiny D2, Tiny D3, long tubular 30% stenosis in mid LAD; RCA: patent stents in proximal RCA, normal PDA, 2 small posterolateral branches    Past Surgical History:  Procedure Laterality Date  . APPENDECTOMY  1972  . CARDIAC CATHETERIZATION     3/14(x2), 5/14  . CARDIAC SURGERY    . COLONOSCOPY WITH PROPOFOL N/A 04/28/2015   Procedure: COLONOSCOPY WITH PROPOFOL;  Surgeon: Lucilla Lame, MD;  Location: [facility];  Service: Endoscopy;  Laterality: N/A;  . POLYPECTOMY  04/28/2015   Procedure:  POLYPECTOMY;  Surgeon: Lucilla Lame, MD;  Location: [facility];  Service: Endoscopy;;  . rt. ACL surgery  1993   [facility]    Allergies  Allergen Reactions  . Lisinopril Other (See Comments)    Severe chest pain and fatigue  . Beta Adrenergic Blockers Other (See Comments)    Hypotension  . Lipitor [Atorvastatin] Other (See Comments)    Myalgias     Current Outpatient Prescriptions  Medication Sig Dispense Refill  . aspirin 81 MG tablet Take 81 mg by mouth daily.    Marland Kitchen atorvastatin (LIPITOR) 20 MG tablet Take 1 tablet (20 mg total) by mouth daily. 90 tablet 1  . clopidogrel (PLAVIX) 75 MG tablet Take 75 mg by mouth daily.    . clorazepate (TRANXENE) 7.5 MG tablet Take 7.5 mg by mouth 2 (two) times daily as needed for anxiety. prn    . metoprolol tartrate (LOPRESSOR) 25 MG tablet Take 12.5 mg by mouth 2 (two) times daily.    Marland Kitchen  nitroGLYCERIN (NITROSTAT) 0.4 MG SL tablet Place 0.4 mg under the tongue every 5 (five) minutes as needed for chest pain.    . pantoprazole (PROTONIX) 40 MG tablet Take 1 tablet (40 mg total) by mouth daily. 30 tablet 3  . zolpidem (AMBIEN) 10 MG tablet Take 10 mg by mouth at bedtime as needed.      No current facility-administered medications for this visit.     Socially he is married. He works as a Oceanographer. He had smoked for 40 years but quit the day of his heart attack. Mostly, he did have a history of alcohol and drug use but he quit approximately 14 years ago.  Family History  Problem Relation Age of Onset  . Heart disease Father   . Heart attack Father   . Heart disease Brother   . Heart attack Brother   . Heart disease Paternal Grandfather   . Heart attack Mother     ROS General: Negative; No fevers, chills, or night sweats; Significant fatigue and increased stress HEENT: Negative; No changes in vision or hearing, sinus congestion, difficulty swallowing Pulmonary: Negative; No cough, wheezing, shortness of breath,  hemoptysis Cardiovascular: see HPI GI: Negative; No nausea, vomiting, diarrhea, or abdominal pain GU: Negative; No dysuria, hematuria, or difficulty voiding Musculoskeletal: Negative; no myalgias, joint pain, or weakness Hematologic/Oncology: Negative; no easy bruising, bleeding Endocrine: Negative; no heat/cold intolerance; no diabetes Neuro: Negative; no changes in balance, headaches Skin: Negative; No rashes or skin lesions Psychiatric: Negative; No behavioral problems, depression Sleep: Very poor sleep with loud snoring, no daytime sleepiness, hypersomnolence, bruxism, restless legs, hypnogognic hallucinations, no cataplexy Other comprehensive 14 point system review is negative.   PE BP 120/80   Pulse (!) 59   Ht 6' 1" (1.854 m)   Wt 217 lb (98.4 kg)   BMI 28.63 kg/m     Repeat blood pressure was 120/70, both supine and standing.  Wt Readings from Last 3 Encounters:  12/31/16 217 lb (98.4 kg)  05/23/16 217 lb 9.6 oz (98.7 kg)  07/25/15 201 lb (91.2 kg)   General: Alert, oriented, no distress.  Skin: normal turgor, no rashes, warm and dry HEENT: Normocephalic, atraumatic. Pupils equal round and reactive to light; sclera anicteric; extraocular muscles intact;  Nose without nasal septal hypertrophy Mouth/Parynx benign; Mallinpatti scale 3 Neck: No JVD, no carotid bruits; normal carotid upstroke Lungs: Breath sounds decreased diffusely, without wheezing or rales Chest wall: without tenderness to palpitation Heart: PMI not displaced, RRR, s1 s2 normal, 1/6 systolic murmur, no diastolic murmur, no rubs, gallops, thrills, or heaves Abdomen: soft, nontender; no hepatosplenomehaly, BS+; abdominal aorta nontender and not dilated by palpation. Back: no CVA tenderness Pulses 2+ Musculoskeletal: full range of motion, normal strength, no joint deformities Extremities: no clubbing cyanosis or edema, Homan's sign negative  Neurologic: grossly nonfocal; Cranial nerves grossly  wnl Psychologic: Normal mood and affect   ECG (independently read by me): Sinus bradycardia 59 bpm.  Old inferior Q waves in leads 3 and aVF.  No significant ST-T changes.  Normal intervals.  No ectopy.  March 2018 ECG (independently read by me): Sinus bradycardia 53 bpm.  Old inferior Q waves 3 and aVF.  Normal intervals.  No ST segment changes.  May 2017 ECG (independently read by me): Normal sinus rhythm at 64 bpm.  Borderline LVH by voltage criteria.  Old inferior MI with Q waves.  September 2016 ECG (independently read by me): Normal sinus rhythm at 67 bpm.  Q waves inferiorly,  compatible with his old inferior MI.  No significant ST changes.  May 2016 ECG (independently read by me): Normal sinus rhythm at 73 bpm.  Occasional unifocal PVCs.  November 2015 ECG (independently read by me): Sinus rhythm with PVC.  Old inferior by with Q waves in leads 3 and aVF.  Mild RV conduction delay.  Prior 11/20/2012 ECG: Sinus bradycardia at 57 beats per minute. Evidence for old inferior infarct with Q wave in 3.  T-wave inversion in 3 and F and also T-wave inversion V1 through V4.   BMP Latest Ref Rng & Units 03/18/2015 10/29/2014 08/19/2014  Glucose 65 - 99 mg/dL 87 101(H) -  BUN 6 - 20 mg/dL _0 Creatinine 0.61 - 1.24 mg/dL 1.13 1.11 1.1  Sodium 135 - 145 mmol/L 138 138 140  Potassium 3.5 - 5.1 mmol/L 3.7 3.5 4.4  Chloride 101 - 111 mmol/L 106 108 -  CO2 22 - 32 mmol/L 26 24 -  Calcium 8.9 - 10.3 mg/dL 9.1 8.8(L) -   Hepatic Function Latest Ref Rng & Units 03/18/2015 08/19/2014 08/20/2012  Total Protein 6.5 - 8.1 g/dL 6.8 - 6.7  Albumin 3.5 - 5.0 g/dL 4.1 - 3.9  AST 15 - 41 U/L _1 ALT 17 - 63 U/L 34 18 23  Alk Phosphatase 38 - 126 U/L 57 65 61  Total Bilirubin 0.3 - 1.2 mg/dL 1.0 - 0.6   CBC Latest Ref Rng & Units 03/18/2015 10/29/2014 08/19/2014  WBC 3.8 - 10.6 K/uL 14.0(H) 7.2 7.6  Hemoglobin 13.0 - 18.0 g/dL 13.3 13.9 14.9  Hematocrit 40.0 - 52.0 % 39.1(L) 40.2 42  Platelets  150 - 440 K/uL 136(L) 142(L) 172   Lab Results  Component Value Date   TSH 2.602 08/20/2012  No results found for: HGBA1C  Lipid Panel     Component Value Date/Time   CHOL 102 08/19/2014   CHOL 100 12/29/2013 0450   TRIG 104 08/19/2014   TRIG 124 12/29/2013 0450   HDL 32 (A) 08/19/2014   HDL 27 (L) 12/29/2013 0450   VLDL 25 12/29/2013 0450   LDLCALC 49 08/19/2014   LDLCALC 48 12/29/2013 0450    RADIOLOGY: No results found.  IMPRESSION:  1. Atypical chest pain   2. Coronary artery disease due to lipid rich plaque   3. Old MI (myocardial infarction)   4. Hyperlipidemia with target LDL less than 70   5. Medication management   6. Chronic fatigue     ASSESSMENT AND PLAN:   Mr. Saltzman is a 56 year old white male who suffered an inferior wall myocardial infarction on 06/03/2012. Primary intervention was difficult but ultimately successful at [facility] following a failed attempt at [facility] with insertion of 2 DES Promus Premier  stents into his proximal RCA.  He has mild concomitant coronary artery disease with smooth tubular narrowing of his middle LAD of 30%.  He did not tolerate even a very low dose of  ACE inhibition and this may have been due to hypotension particularly in the setting of extreme heat and humidity while working.  He continues to be on dual antiplatelet therapy with aspirin and Plavix.  His last nuclear perfusion study done at [place] was unchanged and showed a small to medium size mild to moderate intensity fixed perfusion defect in the basal to midinferior wall consistent with his known prior inferior MI without associated ischemia.  Over the past 2 years.  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  He recently experienced an episode of chest pain that persisted all day and later that day had taken nitroglycerin.  He felt after 10-15 minutes.  There was some nitrate responsiveness.  That particular day was very hot.  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  His blood pressure today is stable on low-dose metoprolol, which he takes 25 mg daily.  I have suggested he change this and take 12.5 mg twice day. His GERD is controlled with Protonix.  He takes Tranxene for anxiety as needed.  I reviewed laboratory from his primary physician done in March 2018.  On that report, the lipid studies were still pending and we will try to find out final result.  He will return to his primary physician in [place].  I will see him in 6 months for reevaluation.  Time spent: 25 minutes  Troy Sine, MD, [facility] 01/01/2017 6:40 PM

## 2016-12-31 NOTE — Patient Instructions (Signed)
Medication Instructions:  Take your metoprolol tartrate (Lopressor) 12.5 mg (1/2 tablet) two times daily  Follow-Up: Your physician wants you to follow-up in: 6 months with Dr. Claiborne Billings.  You will receive a reminder letter in the mail two months in advance. If you don't receive a letter, please call our office to schedule the follow-up appointment.   Any Other Special Instructions Will Be Listed Below (If Applicable).     If you need a refill on your cardiac medications before your next appointment, please call your pharmacy.

## 2017-03-28 ENCOUNTER — Other Ambulatory Visit: Payer: Self-pay | Admitting: *Deleted

## 2017-03-28 NOTE — Telephone Encounter (Signed)
Patients wife, Verdie Drown called and requested a refill on clorazepate for the patient. She stated that his pcp has been refilling this but they closed down and he has been placed on a waiting list with a new PCP. Verdie Drown can be reached at [phone]. Thanks, MI

## 2017-03-28 NOTE — Telephone Encounter (Signed)
Patients wife called and stated that the pharmacy was able to get this handled, patients pcp had sent in a new rx before their office closed down.

## 2017-10-06 ENCOUNTER — Telehealth: Payer: Self-pay | Admitting: Cardiovascular Disease

## 2017-10-07 ENCOUNTER — Encounter: Payer: Self-pay | Admitting: Cardiovascular Disease

## 2017-10-07 ENCOUNTER — Ambulatory Visit: Payer: 59 | Admitting: Cardiovascular Disease

## 2017-10-07 ENCOUNTER — Encounter

## 2017-10-07 VITALS — BP 128/80 | HR 53 | Ht 73.0 in | Wt 219.6 lb

## 2017-10-07 DIAGNOSIS — R001 Bradycardia, unspecified: Secondary | ICD-10-CM

## 2017-10-07 DIAGNOSIS — I252 Old myocardial infarction: Secondary | ICD-10-CM

## 2017-10-07 DIAGNOSIS — I2583 Coronary atherosclerosis due to lipid rich plaque: Secondary | ICD-10-CM

## 2017-10-07 DIAGNOSIS — I2510 Atherosclerotic heart disease of native coronary artery without angina pectoris: Secondary | ICD-10-CM

## 2017-10-07 DIAGNOSIS — K219 Gastro-esophageal reflux disease without esophagitis: Secondary | ICD-10-CM

## 2017-10-07 DIAGNOSIS — E785 Hyperlipidemia, unspecified: Secondary | ICD-10-CM | POA: Diagnosis not present

## 2017-10-07 NOTE — Patient Instructions (Signed)

## 2017-10-07 NOTE — Progress Notes (Signed)
Patient ID: Aaron Kelley, male   DOB: 04/19/1960, 57 y.o.   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  Aaron Kelley has a history of CAD and developed recurrent episodes of chest discomfort associated with significant shortness of breath while at work on 06/03/2012.  He presented to [facility] and initial catheterization was done acutely by [name].  An attempted intervention to his subtotally occluded RCA was unsuccessful and the patient was  transported to [facility] and subsequently underwent successful intervention with placement of two 3.0x24 mm Promus premier drug-eluting stents. He was discharged on aspirin, atorvastatin 80 mg, and Lopressor but he develop significant issues with  fatigability. He saw the physician assistant on several occasions at [facility] and ultimately he was taken off his beta blocker therapy. An echo Doppler study on Jul 23, 2012  showed an ejection fraction of approximately 50% with inferior hypocontractility, as well as mild concentric LVH and grade 1 diastolic dysfunction. Several days later on Jul 28, 2012 due to  recurrent chest discomfort and T-wave changes he underwent repeat cardiac catheterization at [facility]. This showed patent stents in the proximal RCA. He did have a long tubular 30% stenosis in the mid LAD. Other vessels were normal. The patient had subsequently seen Dr. [name] and was referred to me for subsequent post-MI management and care.  When I initially saw him on August 19, 2012 I  recommended an initial trial of ACE inhibitor and started him on low-dose lisinopril. He could not tolerate this small dose and experienced some recurrent chest pain. An NMR  lipoprofile  revealed a calculated LDL of 41 with an LDL particle number of 711. HDL cholesterol was low at 34 and his HDL particle number remains low at 24.9. Triglycerides  are excellent at 80. Insulin resistance was slightly elevated at 52. Hemoglobin was 14.1 hematocrit 41.1 BUN 12 a 1.12. Fasting glucose mildly elevated at 123. I also performed a  P2Y12 assessment of platelet function since he was on Plavix and this revealed adequate platelet response with a value of 191. Vitamin D. level was normal at 50. TSH was 2.602.  A nuclear perfusion study on 05/18/2013 revealed good exercise capacity without chest pain or ECG changes was felt to be low risk with a small area of fixed inferobasal defect, suggesting of a small scar versus diaphragmatic attenuation.  There was no ischemia.  Bood work from 05/10/2013 done by Dr. [name]: Total cholesterol was 115, LDL cholesterol 54, HDL cholesterol, mildly low at 37, and triglycerides of 118.  When I saw him I was concerned about obstructive sleep apnea. His wife commented that he was experiencing very loud snoring.  He admitted that his sleep wa unbearable.  I strongly recommended a sleep evaluation but he has since still not pursued this.  He  developed an episode of chest pain while at work after he was extremely fatigued following a stomach virus which had resulted in significant diarrhea and vomiting leading to dehydration.  He was evaluated at [facility] where he was admitted overnight.  Enzymes were negative.  He subsequent underwent another nuclear perfusion study on 12/29/2013.  He exercised for 8 minutes and 30 seconds and achieved a 10 minute workload and peak heart rate of 148 bpm.  Scintigraphic images again showed a small to medium-sized mild to moderate  intensity fixed perfusion defect in the basal to mid inferior wall consistent with his known prior inferior MI.  Last year he was evaluated in the emergency room with a different chest pain that was more sharp.  Troponin was negative.  He was given a dose of isosorbide 30 mg but has not been on this regularly.  He denies recurrent chest pain  of any similar characteristic to his prior heart attack.  He has been working outside over the summer and heat has led to significant fatigue.  He is unaware of palpitations.  He has continued to be under increased stress.  His mother had passed away.  In addition, his father died in March 03, 2016.  His 20 year old son has developed a large tumor on his sciatic nerve and has undergone numerous neurologic evaluations and was referred to the [facility].    Since I last saw him, he has established primary care with Dr. Haskell Riling after Dr. [name] death and subsequently Dr. Jerene Dilling retirement.  He is being monitored for thyroid growth.  He denies any episodes of chest pain or shortness of breath.  He continues to work in [employer] and has had some issues with the extreme heat over the past several weeks.  He denies any episodes of chest pain.  He denies palpitations.  Laboratory by Dr. Francoise Schaumann had shown elevation of triglycerides at 291 and for this reason he was started on fenofibrate to take in addition to atorvastatin.  Most recent laboratory has shown improvement with triglycerides at 95.  His most recent LDL cholesterol was 60 in May 2019 with a total cholesterol of 118.  He presents for follow-up cardiology evaluation.  Past Medical History:  Diagnosis Date  . Anxiety    on chlorazepate, no preventative  . Bone spur of left foot    Heel  . Coronary artery disease 05/2012   MI tx. @ [facility] 2 stents placed   . Diverticulitis   . GERD (gastroesophageal reflux disease)   . Hypercholesteremia   . Laryngopharyngeal reflux (LPR)   . LV dysfunction 07/26/12   EF 50%  . MI, old   . Mild pulmonic regurgitation and RV dysfunction by prior echocardiogram 07/26/12  . S/P cardiac cath 07/28/12   Normal L main, Small ramus, L circumflex: small OM1, large branching OM2, small OM3; LAD: Tiny D1, tiny D2, Tiny D3, long tubular 30% stenosis in mid LAD; RCA: patent stents in proximal RCA,  normal PDA, 2 small posterolateral branches    Past Surgical History:  Procedure Laterality Date  . APPENDECTOMY  1972  . CARDIAC CATHETERIZATION     3/14(x2), 5/14  . CARDIAC SURGERY    . COLONOSCOPY WITH PROPOFOL N/A 04/28/2015   Procedure: COLONOSCOPY WITH PROPOFOL;  Surgeon: Lucilla Lame, MD;  Location: [place];  Service: Endoscopy;  Laterality: N/A;  . POLYPECTOMY  04/28/2015   Procedure: POLYPECTOMY;  Surgeon: Lucilla Lame, MD;  Location: [place];  Service: Endoscopy;;  . rt. ACL surgery  1993   [facility]    Allergies  Allergen Reactions  . Lisinopril Other (See Comments)    Severe chest pain and fatigue  . Beta Adrenergic Blockers Other (See Comments)    Hypotension  . Lipitor [Atorvastatin] Other (See Comments)    Myalgias     Current Outpatient Medications  Medication Sig Dispense Refill  . aspirin 81 MG tablet Take 81 mg by mouth daily.    Marland Kitchen atorvastatin (LIPITOR) 20 MG tablet  Take 1 tablet (20 mg total) by mouth daily. 90 tablet 1  . clopidogrel (PLAVIX) 75 MG tablet Take 75 mg by mouth daily.    . clorazepate (TRANXENE) 7.5 MG tablet Take 7.5 mg by mouth 2 (two) times daily as needed for anxiety. prn    . fenofibrate (TRICOR) 145 MG tablet Take 145 mg by mouth daily.    . metoprolol tartrate (LOPRESSOR) 25 MG tablet Take 12.5 mg by mouth 2 (two) times daily.    . nitroGLYCERIN (NITROSTAT) 0.4 MG SL tablet Place 0.4 mg under the tongue every 5 (five) minutes as needed for chest pain.    . pantoprazole (PROTONIX) 40 MG tablet Take 1 tablet (40 mg total) by mouth daily. 30 tablet 3  . zolpidem (AMBIEN) 10 MG tablet Take 10 mg by mouth at bedtime as needed.      No current facility-administered medications for this visit.     Socially he is married. He works as a Oceanographer. He had smoked for 40 years but quit the day of his heart attack. Mostly, he did have a history of alcohol and drug use but he quit approximately 14 years ago.  Family  History  Problem Relation Age of Onset  . Heart disease Father   . Heart attack Father   . Heart disease Brother   . Heart attack Brother   . Heart disease Paternal Grandfather   . Heart attack Mother     ROS General: Negative; No fevers, chills, or night sweats; Significant fatigue and increased stress HEENT: Negative; No changes in vision or hearing, sinus congestion, difficulty swallowing Pulmonary: Negative; No cough, wheezing, shortness of breath, hemoptysis Cardiovascular: see HPI GI: Negative; No nausea, vomiting, diarrhea, or abdominal pain GU: Negative; No dysuria, hematuria, or difficulty voiding Musculoskeletal: Negative; no myalgias, joint pain, or weakness Hematologic/Oncology: Negative; no easy bruising, bleeding Endocrine: Negative; no heat/cold intolerance; no diabetes Neuro: Negative; no changes in balance, headaches Skin: Negative; No rashes or skin lesions Psychiatric: Negative; No behavioral problems, depression Sleep: Very poor sleep with loud snoring, no daytime sleepiness, hypersomnolence, bruxism, restless legs, hypnogognic hallucinations, no cataplexy Other comprehensive 14 point system review is negative.   PE BP 128/80   Pulse (!) 53   Ht _0  (1.854 m)   Wt 219 lb 9.6 oz (99.6 kg)   BMI 28.97 kg/m     Repeat blood pressure by me was 130/76.  Wt Readings from Last 3 Encounters:  10/07/17 219 lb 9.6 oz (99.6 kg)  12/31/16 217 lb (98.4 kg)  05/23/16 217 lb 9.6 oz (98.7 kg)   General: Alert, oriented, no distress.  Skin: normal turgor, no rashes, warm and dry HEENT: Normocephalic, atraumatic. Pupils equal round and reactive to light; sclera anicteric; extraocular muscles intact;  Nose without nasal septal hypertrophy Mouth/Parynx benign; Mallinpatti scale 3 Neck: No JVD, no carotid bruits; normal carotid upstroke Lungs: clear to ausculatation and percussion; no wheezing or rales Chest wall: without tenderness to palpitation Heart: PMI not  displaced, RRR, s1 s2 normal, 1/6 systolic murmur, no diastolic murmur, no rubs, gallops, thrills, or heaves Abdomen: soft, nontender; no hepatosplenomehaly, BS+; abdominal aorta nontender and not dilated by palpation. Back: no CVA tenderness Pulses 2+ Musculoskeletal: full range of motion, normal strength, no joint deformities Extremities: no clubbing cyanosis or edema, Homan's sign negative  Neurologic: grossly nonfocal; Cranial nerves grossly wnl Psychologic: Normal mood and affect  ECG (independently read by me): Sinus bradycardia 53 bpm.  Old inferior MI with Q  waves in 3 and aVF.  October 2018 ECG (independently read by me): Sinus bradycardia 59 bpm.  Old inferior Q waves in leads 3 and aVF.  No significant ST-T changes.  Normal intervals.  No ectopy.  March 2018 ECG (independently read by me): Sinus bradycardia 53 bpm.  Old inferior Q waves 3 and aVF.  Normal intervals.  No ST segment changes.  May 2017 ECG (independently read by me): Normal sinus rhythm at 64 bpm.  Borderline LVH by voltage criteria.  Old inferior MI with Q waves.  September 2016 ECG (independently read by me): Normal sinus rhythm at 67 bpm.  Q waves inferiorly, compatible with his old inferior MI.  No significant ST changes.  May 2016 ECG (independently read by me): Normal sinus rhythm at 73 bpm.  Occasional unifocal PVCs.  November 2015 ECG (independently read by me): Sinus rhythm with PVC.  Old inferior by with Q waves in leads 3 and aVF.  Mild RV conduction delay.  Prior 11/20/2012 ECG: Sinus bradycardia at 57 beats per minute. Evidence for old inferior infarct with Q wave in 3.  T-wave inversion in 3 and F and also T-wave inversion V1 through V4.   BMP Latest Ref Rng & Units 03/18/2015 10/29/2014 08/19/2014  Glucose 65 - 99 mg/dL 87 101(H) -  BUN 6 - 20 mg/dL _0 Creatinine 0.61 - 1.24 mg/dL 1.13 1.11 1.1  Sodium 135 - 145 mmol/L 138 138 140  Potassium 3.5 - 5.1 mmol/L 3.7 3.5 4.4  Chloride 101 - 111  mmol/L 106 108 -  CO2 22 - 32 mmol/L 26 24 -  Calcium 8.9 - 10.3 mg/dL 9.1 8.8(L) -   Hepatic Function Latest Ref Rng & Units 03/18/2015 08/19/2014 08/20/2012  Total Protein 6.5 - 8.1 g/dL 6.8 - 6.7  Albumin 3.5 - 5.0 g/dL 4.1 - 3.9  AST 15 - 41 U/L _1 ALT 17 - 63 U/L 34 18 23  Alk Phosphatase 38 - 126 U/L 57 65 61  Total Bilirubin 0.3 - 1.2 mg/dL 1.0 - 0.6   CBC Latest Ref Rng & Units 03/18/2015 10/29/2014 08/19/2014  WBC 3.8 - 10.6 K/uL 14.0(H) 7.2 7.6  Hemoglobin 13.0 - 18.0 g/dL 13.3 13.9 14.9  Hematocrit 40.0 - 52.0 % 39.1(L) 40.2 42  Platelets 150 - 440 K/uL 136(L) 142(L) 172   Lab Results  Component Value Date   TSH 2.602 08/20/2012  No results found for: HGBA1C  Lipid Panel     Component Value Date/Time   CHOL 102 08/19/2014   CHOL 100 12/29/2013 0450   TRIG 104 08/19/2014   TRIG 124 12/29/2013 0450   HDL 32 (A) 08/19/2014   HDL 27 (L) 12/29/2013 0450   VLDL 25 12/29/2013 0450   LDLCALC 49 08/19/2014   LDLCALC 48 12/29/2013 0450    RADIOLOGY: No results found.  IMPRESSION:  No diagnosis found.  ASSESSMENT AND PLAN:   Aaron Kelley is a 57 year old white male who suffered an inferior wall myocardial infarction on 06/03/2012. PCI was difficult but ultimately successful at [facility] following a failed attempt at [facility] with insertion of 2 DES Promus Premier  stents into his proximal RCA.  He has mild concomitant coronary artery disease with smooth tubular narrowing of his middle LAD of 30%.  He did not tolerate even a very low dose of  ACE inhibition and this may have been due to hypotension particularly in the setting of extreme heat and humidity while working.  He continues to be on dual antiplatelet therapy with aspirin and Plavix.  He denies any bleeding.  His last nuclear perfusion study done at [facility] was unchanged and showed small to medium sized fixed defect of mild to moderate intensity inferiorly consistent with his prior MI.  He is  now seeing Dr. Haskell Riling for his primary care who also does nuclear medicine.  He is now on combination therapy with atorvastatin and fenofibrate for mixed hyperlipidemia.  Most recent lipid studies are excellent.  With reference to his CAD, he is not having any recurrent anginal symptoms.  He has sinus bradycardia on low-dose metoprolol at 12.5 mg twice a day.  His GERD is controlled with pantoprazole.  As long as he remains stable, I will see him in 1 year for reevaluation.  Time spent: 25  minutes  Troy Sine, MD, [facility] 10/07/2017 2:51 PM

## 2017-10-09 ENCOUNTER — Encounter: Payer: Self-pay | Admitting: Cardiovascular Disease

## 2018-08-09 ENCOUNTER — Encounter: Payer: Self-pay | Admitting: *Deleted

## 2018-08-09 ENCOUNTER — Other Ambulatory Visit: Payer: Self-pay

## 2018-08-09 ENCOUNTER — Emergency Department
Admission: EM | Admit: 2018-08-09 | Discharge: 2018-08-09 | Disposition: A | Discharge status: Home / Self Care | Payer: 59 | Attending: Emergency Medicine | Admitting: Emergency Medicine

## 2018-08-09 ENCOUNTER — Emergency Department: Payer: 59

## 2018-08-09 DIAGNOSIS — Z79899 Other long term (current) drug therapy: Secondary | ICD-10-CM | POA: Diagnosis not present

## 2018-08-09 DIAGNOSIS — Z87891 Personal history of nicotine dependence: Secondary | ICD-10-CM | POA: Insufficient documentation

## 2018-08-09 DIAGNOSIS — R0602 Shortness of breath: Secondary | ICD-10-CM

## 2018-08-09 DIAGNOSIS — Z1159 Encounter for screening for other viral diseases: Secondary | ICD-10-CM | POA: Diagnosis not present

## 2018-08-09 DIAGNOSIS — Z7982 Long term (current) use of aspirin: Secondary | ICD-10-CM | POA: Diagnosis not present

## 2018-08-09 LAB — CBC WITH DIFFERENTIAL/PLATELET
Abs Immature Granulocytes: 0.01 10*3/uL (ref 0.00–0.07)
Basophils Absolute: 0 10*3/uL (ref 0.0–0.1)
Basophils Relative: 1 %
Eosinophils Absolute: 0 10*3/uL (ref 0.0–0.5)
Eosinophils Relative: 1 %
HCT: 42.5 % (ref 39.0–52.0)
Hemoglobin: 14.3 g/dL (ref 13.0–17.0)
Immature Granulocytes: 0 %
Lymphocytes Relative: 38 %
Lymphs Abs: 1.5 10*3/uL (ref 0.7–4.0)
MCH: 28.4 pg (ref 26.0–34.0)
MCHC: 33.6 g/dL (ref 30.0–36.0)
MCV: 84.5 fL (ref 80.0–100.0)
Monocytes Absolute: 0.3 10*3/uL (ref 0.1–1.0)
Monocytes Relative: 8 %
Neutro Abs: 2.1 10*3/uL (ref 1.7–7.7)
Neutrophils Relative %: 52 %
Platelets: 163 10*3/uL (ref 150–400)
RBC: 5.03 MIL/uL (ref 4.22–5.81)
RDW: 14.9 % (ref 11.5–15.5)
WBC: 3.9 10*3/uL — ABNORMAL LOW (ref 4.0–10.5)
nRBC: 0 % (ref 0.0–0.2)

## 2018-08-09 LAB — COMPREHENSIVE METABOLIC PANEL
ALT: 54 U/L — ABNORMAL HIGH (ref 0–44)
AST: 40 U/L (ref 15–41)
Albumin: 4.1 g/dL (ref 3.5–5.0)
Alkaline Phosphatase: 57 U/L (ref 38–126)
Anion gap: 7 (ref 5–15)
BUN: 13 mg/dL (ref 6–20)
CO2: 24 mmol/L (ref 22–32)
Calcium: 8.8 mg/dL — ABNORMAL LOW (ref 8.9–10.3)
Chloride: 107 mmol/L (ref 98–111)
Creatinine, Ser: 1.11 mg/dL (ref 0.61–1.24)
GFR calc Af Amer: >60 mL/min (ref >60)
GFR calc non Af Amer: >60 mL/min (ref >60)
Glucose, Bld: 123 mg/dL — ABNORMAL HIGH (ref 70–99)
Potassium: 3.7 mmol/L (ref 3.5–5.1)
Sodium: 138 mmol/L (ref 135–145)
Total Bilirubin: 0.6 mg/dL (ref 0.3–1.2)
Total Protein: 6.7 g/dL (ref 6.5–8.1)

## 2018-08-09 LAB — TROPONIN I
Troponin I: <0.03 ng/mL (ref <0.03)
Troponin I: <0.03 ng/mL (ref <0.03)

## 2018-08-09 LAB — SARS CORONAVIRUS 2 BY RT PCR (HOSPITAL ORDER, PERFORMED IN ~~LOC~~ HOSPITAL LAB): SARS Coronavirus 2: NEGATIVE

## 2018-08-09 MED ORDER — DOXYCYCLINE HYCLATE 100 MG PO CAPS: 100.0000 mg | ORAL_CAPSULE | Freq: Two times a day (BID) | ORAL | 0 refills | Status: AC

## 2018-08-09 NOTE — ED Provider Notes (Signed)
2nd troponin negative. Discussed this result with the patient. At this point unclear etiology of patient's symptoms. Given however tick exposure will treat for tick bourne illness. Discussed plan with patient discussed close PCP follow up.    Nance Pear, MD 08/09/18 [phone]

## 2018-08-09 NOTE — ED Provider Notes (Signed)
[facility] Emergency Department Provider Note  ____________________________________________   None    (approximate)  I have reviewed the triage vital signs and the nursing notes.   HISTORY  Chief Complaint Chest Pain and Shortness of Breath    HPI Aaron Kelley is a 58 y.o. male presents emergency department complaining of shortness of breath  and some chest pain for approximately a week.  He states is been intermittent.  Shortness of breath is worse on exertion.  He states he has not felt well did  have a fever on Friday.  Patient is also had multiple tick bites.  States the areas have been itchy and reddened but has not had any other rash.  No joint pain.  He has a cardiac history which includes stents.  His cardiologist is Dr. Claiborne Billings in [place] although he has been seen by Dr. Rockey Situ when he was admitted here.  He denies any swelling of the extremities.   Past Medical History:  Diagnosis Date  . Anxiety    on chlorazepate, no preventative  . Bone spur of left foot    Heel  . Coronary artery disease 05/2012   MI tx. @ [facility] 2 stents placed   . Diverticulitis   . GERD (gastroesophageal reflux disease)   . Hypercholesteremia   . Laryngopharyngeal reflux (LPR)   . LV dysfunction 07/26/12   EF 50%  . MI, old   . Mild pulmonic regurgitation and RV dysfunction by prior echocardiogram 07/26/12  . S/P cardiac cath 07/28/12   Normal L main, Small ramus, L circumflex: small OM1, large branching OM2, small OM3; LAD: Tiny D1, tiny D2, Tiny D3, long tubular 30% stenosis in mid LAD; RCA: patent stents in proximal RCA, normal PDA, 2 small posterolateral branches    Patient Active Problem List   Diagnosis Date Noted  . Fatigue 07/25/2015  . Special screening for malignant neoplasms, colon   . Benign neoplasm of sigmoid colon   . Solitary pulmonary nodule on lung CT 04/12/2015  . Atherosclerosis 04/12/2015  . Hiatal hernia 04/12/2015  . Diverticulitis of  sigmoid colon 04/12/2015  . Atypical chest pain 12/14/2014  . Old MI (myocardial infarction) 12/14/2014  . Environmental and seasonal allergies 08/16/2014  . PVC's (premature ventricular contractions) 08/16/2014  . CAD (coronary artery disease) 02/09/2014  . Evaluate for Sleep apnea 07/04/2013  . Acute MI, inferior wall, subsequent episode of care ([place]) 08/19/2012  . Hyperlipidemia with target LDL less than 70 08/19/2012  . Tobacco use 08/19/2012    Past Surgical History:  Procedure Laterality Date  . APPENDECTOMY  1972  . CARDIAC CATHETERIZATION     3/14(x2), 5/14  . CARDIAC SURGERY    . COLONOSCOPY WITH PROPOFOL N/A 04/28/2015   Procedure: COLONOSCOPY WITH PROPOFOL;  Surgeon: Lucilla Lame, MD;  Location: [place];  Service: Endoscopy;  Laterality: N/A;  . POLYPECTOMY  04/28/2015   Procedure: POLYPECTOMY;  Surgeon: Lucilla Lame, MD;  Location: [place];  Service: Endoscopy;;  . rt. ACL surgery  1993   [facility]    Prior to Admission medications   Medication Sig Start Date End Date Taking? Authorizing Provider  aspirin 81 MG tablet Take 81 mg by mouth daily.    [provider]  atorvastatin (LIPITOR) 20 MG tablet Take 1 tablet (20 mg total) by mouth daily. 07/04/15   Park Liter P, DO  clopidogrel (PLAVIX) 75 MG tablet Take 75 mg by mouth daily.    [provider]  clorazepate (TRANXENE) 7.5 MG tablet Take 7.5 mg by mouth 2 (two) times daily as needed for anxiety. prn    [provider]  fenofibrate (TRICOR) 145 MG tablet Take 145 mg by mouth daily.    [provider]  metoprolol tartrate (LOPRESSOR) 25 MG tablet Take 12.5 mg by mouth 2 (two) times daily.    [provider]  nitroGLYCERIN (NITROSTAT) 0.4 MG SL tablet Place 0.4 mg under the tongue every 5 (five) minutes as needed for chest pain.    [provider]  pantoprazole (PROTONIX) 40 MG tablet Take 1 tablet (40 mg total) by mouth daily. 06/28/15    Johnson, Megan P, DO  zolpidem (AMBIEN) 10 MG tablet Take 10 mg by mouth at bedtime as needed.  03/30/15   [provider]    Allergies Lisinopril; Beta adrenergic blockers; and Lipitor [atorvastatin]  Family History  Problem Relation Age of Onset  . Heart disease Father   . Heart attack Father   . Heart disease Brother   . Heart attack Brother   . Heart disease Paternal Grandfather   . Heart attack Mother     Social History Social History   Tobacco Use  . Smoking status: Former Smoker    Packs/day: 1.00    Years: 35.00    Pack years: 35.00    Types: Cigarettes    Last attempt to quit: 06/03/2012    Years since quitting: 6.1  . Smokeless tobacco: Current User    Types: Snuff  . Tobacco comment: 1/2 can/day  Substance Use Topics  . Alcohol use: No  . Drug use: No    Review of Systems  Constitutional: Positive fever/chills Eyes: No visual changes. ENT: No sore throat. Respiratory: Denies cough, positive shortness of breath Cardiovascular: Positive for chest pain Genitourinary: Negative for dysuria. Musculoskeletal: Negative for back pain. Skin: Negative for rash.    ____________________________________________   PHYSICAL EXAM:  VITAL SIGNS: ED Triage Vitals  Enc Vitals Group     BP 08/09/18 1840 117/79     Pulse Rate 08/09/18 1840 75     Resp 08/09/18 1840 17     Temp 08/09/18 1840 99.3 F (37.4 C)     Temp Source 08/09/18 1840 Oral     SpO2 08/09/18 1840 98 %     Weight 08/09/18 1841 212 lb (96.2 kg)     Height 08/09/18 1841 _0  (1.854 m)     Head Circumference --      Peak Flow --      Pain Score --      Pain Loc --      Pain Edu? --      Excl. in [place]? --     Constitutional: Alert and oriented. Well appearing and in no acute distress. Eyes: Conjunctivae are normal.  Head: Atraumatic. Nose: No congestion/rhinnorhea. Mouth/Throat: Mucous membranes are moist.   Neck:  supple no lymphadenopathy noted Cardiovascular: Normal rate,  regular rhythm. Heart sounds are normal Respiratory: Normal respiratory effort.  No retractions, lungs c t a  Abd: soft nontender bs normal all 4 quad GU: deferred Musculoskeletal: FROM all extremities, warm and well perfused Neurologic:  Normal speech and language.  Skin:  Skin is warm, dry and intact. No rash noted.  Small red areas from the tick bites are noted Psychiatric: Mood and affect are normal. Speech and behavior are normal.  ____________________________________________   LABS (all labs ordered are listed, but only abnormal results are displayed)  Labs Reviewed  SARS CORONAVIRUS 2 (HOSPITAL ORDER, [facility])  COMPREHENSIVE METABOLIC PANEL  TROPONIN I  CBC WITH DIFFERENTIAL/PLATELET   ____________________________________________   ____________________________________________  RADIOLOGY  Chest x-ray is normal  ____________________________________________   PROCEDURES  Procedure(s) performed: [place], cardiac monitoring  Procedures    ____________________________________________   INITIAL IMPRESSION / ASSESSMENT AND PLAN / ED COURSE  Pertinent labs & imaging results that were available during my care of the patient were reviewed by me and considered in my medical decision making (see chart for details).   Patient is 58 year old male with history of cardiac stents who is complaining of chest pain and some shortness of breath.  Symptoms for several days.  Is also had a fever.  Multiple tick bites last week.  Physical exam patient appears well and talkative.  Has a low-grade temp at 99.  Remainder the exam is actually unremarkable  EKG no STEMI  Chest x-ray, CBC, met C, troponin, COVID-19 test pending  Discussed patient and testing with Dr. Archie Balboa.  CBC shows a decreased WBC at 3.9, troponin normal, comprehensive metabolic panel shows elevated glucose at 123, ALT increased at 54 COVID test is still pending  Chest x-ray is  normal.  Care will be transferred to Dr. Archie Balboa at this time.    KERBY BORNER was evaluated in Emergency Department on 08/09/2018 for the symptoms described in the history of present illness. He was evaluated in the context of the global COVID-19 pandemic, which necessitated consideration that the patient might be at risk for infection with the SARS-CoV-2 virus that causes COVID-19. Institutional protocols and algorithms that pertain to the evaluation of patients at risk for COVID-19 are in a state of rapid change based on information released by regulatory bodies including the CDC and federal and state organizations. These policies and algorithms were followed during the patient's care in the ED.   As part of my medical decision making, I reviewed the following data within the [facility] notes reviewed and incorporated, Labs reviewed see above, EKG interpreted NSR, Old chart reviewed, Radiograph reviewed chest x-ray normal, Notes from prior ED visits and [place] Controlled Substance Database  ____________________________________________   FINAL CLINICAL IMPRESSION(S) / ED DIAGNOSES  Final diagnoses:  Shortness of breath      NEW MEDICATIONS STARTED DURING THIS VISIT:  New Prescriptions   No medications on file     Note:  This document was prepared using Dragon voice recognition software and may include unintentional dictation errors.    Versie Starks, PA-C 08/09/18 2044    Nance Pear, MD 08/09/18 2104

## 2018-08-09 NOTE — Discharge Instructions (Addendum)
Please seek medical attention for any high fevers, chest pain, shortness of breath, change in behavior, persistent vomiting, bloody stool or any other new or concerning symptoms.  

## 2019-03-25 ENCOUNTER — Other Ambulatory Visit: Payer: Self-pay

## 2019-03-25 ENCOUNTER — Ambulatory Visit: Payer: 59 | Admitting: Cardiovascular Disease

## 2019-03-25 ENCOUNTER — Encounter: Payer: Self-pay | Admitting: Cardiovascular Disease

## 2019-03-25 VITALS — BP 135/83 | HR 71 | Temp 97.1°F | Ht 73.5 in | Wt 219.0 lb

## 2019-03-25 DIAGNOSIS — I498 Other specified cardiac arrhythmias: Secondary | ICD-10-CM | POA: Diagnosis not present

## 2019-03-25 DIAGNOSIS — I2510 Atherosclerotic heart disease of native coronary artery without angina pectoris: Secondary | ICD-10-CM | POA: Diagnosis not present

## 2019-03-25 DIAGNOSIS — E785 Hyperlipidemia, unspecified: Secondary | ICD-10-CM | POA: Diagnosis not present

## 2019-03-25 DIAGNOSIS — I252 Old myocardial infarction: Secondary | ICD-10-CM | POA: Diagnosis not present

## 2019-03-25 DIAGNOSIS — I2583 Coronary atherosclerosis due to lipid rich plaque: Secondary | ICD-10-CM | POA: Diagnosis not present

## 2019-03-25 DIAGNOSIS — K219 Gastro-esophageal reflux disease without esophagitis: Secondary | ICD-10-CM

## 2019-03-25 MED ORDER — METOPROLOL SUCCINATE ER 25 MG PO TB24: 25.0000 mg | ORAL_TABLET | Freq: Every day | ORAL | 3 refills | Status: DC

## 2019-03-25 NOTE — Progress Notes (Signed)
Patient ID: YAKUB LODES, male   DOB: [birth year], 59 y.o.   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  Mr. Havoc Sanluis has a history of CAD and developed recurrent episodes of chest discomfort associated with significant shortness of breath while at work on 06/03/2012.  He presented to [facility] and initial catheterization was done acutely by [name].  An attempted intervention to his subtotally occluded RCA was unsuccessful and the patient was  transported to [facility] and subsequently underwent successful intervention with placement of two 3.0x24 mm Promus premier drug-eluting stents. He was discharged on aspirin, atorvastatin 80 mg, and Lopressor but he develop significant issues with  fatigability. He saw the physician assistant on several occasions at [facility] and ultimately he was taken off his beta blocker therapy. An echo Doppler study on Jul 23, 2012  showed an ejection fraction of approximately 50% with inferior hypocontractility, as well as mild concentric LVH and grade 1 diastolic dysfunction. Several days later on Jul 28, 2012 due to  recurrent chest discomfort and T-wave changes he underwent repeat cardiac catheterization at [facility]. This showed patent stents in the proximal RCA. He did have a long tubular 30% stenosis in the mid LAD. Other vessels were normal. The patient had subsequently seen Dr. [name] and was referred to me for subsequent post-MI management and care.  When I initially saw him on August 19, 2012 I  recommended an initial trial of ACE inhibitor and started him on low-dose lisinopril. He could not tolerate this small dose and experienced some recurrent chest pain. An NMR  lipoprofile  revealed a calculated LDL of 41 with an LDL particle number of 711. HDL cholesterol was low at 34 and his HDL particle number remains low at 24.9. Triglycerides  are excellent at 80. Insulin resistance was slightly elevated at 52. Hemoglobin was 14.1 hematocrit 41.1 BUN 12 a 1.12. Fasting glucose mildly elevated at 123. I also performed a  P2Y12 assessment of platelet function since he was on Plavix and this revealed adequate platelet response with a value of 191. Vitamin D. level was normal at 50. TSH was 2.602.  A nuclear perfusion study on 05/18/2013 revealed good exercise capacity without chest pain or ECG changes was felt to be low risk with a small area of fixed inferobasal defect, suggesting of a small scar versus diaphragmatic attenuation.  There was no ischemia.  Bood work from 05/10/2013 done by Dr. [name]: Total cholesterol was 115, LDL cholesterol 54, HDL cholesterol, mildly low at 37, and triglycerides of 118.  When I saw him I was concerned about obstructive sleep apnea. His wife commented that he was experiencing very loud snoring.  He admitted that his sleep wa unbearable.  I strongly recommended a sleep evaluation but he has since still not pursued this.  He  developed an episode of chest pain while at work after he was extremely fatigued following a stomach virus which had resulted in significant diarrhea and vomiting leading to dehydration.  He was evaluated at [facility] where he was admitted overnight.  Enzymes were negative.  He subsequent underwent another nuclear perfusion study on 12/29/2013.  He exercised for 8 minutes and 30 seconds and achieved a 10 minute workload and peak heart rate of 148 bpm.  Scintigraphic images again showed a small to medium-sized mild to  moderate intensity fixed perfusion defect in the basal to mid inferior wall consistent with his known prior inferior MI.  Last year he was evaluated in the emergency room with a different chest pain that was more sharp.  Troponin was negative.  He was given a dose of isosorbide 30 mg but has not been on this regularly.  He denies recurrent chest pain  of any similar characteristic to his prior heart attack.  He has been working outside over the summer and heat has led to significant fatigue.  He is unaware of palpitations.  He has continued to be under increased stress.  His mother had passed away.  In addition, his father died in 03/02/16.  His 68 year old son has developed a large tumor on his sciatic nerve and has undergone numerous neurologic evaluations and was referred to the [facility].    He established primary care with Dr. Haskell Riling after Dr. [name] death and subsequently Dr. Jerene Dilling retirement.  He is being monitored for thyroid growth.  He denies any episodes of chest pain or shortness of breath.  He continues to work in [employer] and has had some issues with the extreme heat over the past several weeks.  He denies any episodes of chest pain.  He denies palpitations.  Laboratory by Dr. Francoise Schaumann had shown elevation of triglycerides at 291 and for this reason he was started on fenofibrate to take in addition to atorvastatin.  Most recent laboratory has shown improvement with triglycerides at 95.  His  LDL cholesterol was 60 in May 2019 with a total cholesterol of 118.    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  He developed another episode of [place] spotted fever leading to [facility] ER evaluation in May 2020 for which he was treated with doxycycline.  He sees Dr. Francoise Schaumann every 3 months.  He recently had complete set of laboratory and was told that his labs were excellent although glucose was minimally increased at 102.  He has continued to take atorvastatin 20 mg and fenofibrate 145 mg for his mixed hyperlipidemia.  He was supposed to be taking metoprolol tartrate 12.5 mg twice a day but apparently has been taking 25 mg at bedtime.  He denies any anginal symptoms.  He continues to be on aspirin and Plavix.  He denies bleeding.  He has GERD currently controlled with  pantoprazole.  He denies palpitations presyncope or syncope.  He presents for evaluation.   Past Medical History:  Diagnosis Date  . Anxiety    on chlorazepate, no preventative  . Bone spur of left foot    Heel  . Coronary artery disease 05/2012   MI tx. @ [facility] 2 stents placed   . Diverticulitis   . GERD (gastroesophageal reflux disease)   . Hypercholesteremia   . Laryngopharyngeal reflux (LPR)   . LV dysfunction 07/26/12   EF 50%  . MI, old   . Mild pulmonic regurgitation and RV dysfunction by prior echocardiogram 07/26/12  . S/P cardiac cath 07/28/12   Normal L main, Small ramus, L circumflex: small OM1, large branching OM2, small OM3; LAD: Tiny D1, tiny D2, Tiny D3, long tubular 30% stenosis in mid LAD; RCA: patent stents in proximal RCA, normal PDA, 2 small posterolateral branches    Past Surgical History:  Procedure Laterality Date  . APPENDECTOMY  1972  . CARDIAC CATHETERIZATION     3/14(x2), 5/14  .  CARDIAC SURGERY    . COLONOSCOPY WITH PROPOFOL N/A 04/28/2015   Procedure: COLONOSCOPY WITH PROPOFOL;  Surgeon: Lucilla Lame, MD;  Location: [place];  Service: Endoscopy;  Laterality: N/A;  . POLYPECTOMY  04/28/2015   Procedure: POLYPECTOMY;  Surgeon: Lucilla Lame, MD;  Location: [place];  Service: Endoscopy;;  . rt. ACL surgery  1993   [facility]    Allergies  Allergen Reactions  . Lisinopril Other (See Comments)    Severe chest pain and fatigue  . Beta Adrenergic Blockers Other (See Comments)    Hypotension  . Lipitor [Atorvastatin] Other (See Comments)    Myalgias     Current Outpatient Medications  Medication Sig Dispense Refill  . aspirin 81 MG tablet Take 81 mg by mouth daily.    Marland Kitchen atorvastatin (LIPITOR) 20 MG tablet Take 1 tablet (20 mg total) by mouth daily. 90 tablet 1  . clopidogrel (PLAVIX) 75 MG tablet Take 75 mg by mouth daily.    . clorazepate (TRANXENE) 7.5 MG tablet Take 7.5 mg by mouth 2 (two) times daily as needed for  anxiety. prn    . fenofibrate (TRICOR) 145 MG tablet Take 145 mg by mouth daily.    . nitroGLYCERIN (NITROSTAT) 0.4 MG SL tablet Place 0.4 mg under the tongue every 5 (five) minutes as needed for chest pain.    . pantoprazole (PROTONIX) 40 MG tablet Take 1 tablet (40 mg total) by mouth daily. 30 tablet 3  . zolpidem (AMBIEN) 10 MG tablet Take 10 mg by mouth at bedtime as needed.     . metoprolol succinate (TOPROL-XL) 25 MG 24 hr tablet Take 1 tablet (25 mg total) by mouth at bedtime. Take with or immediately following a meal. 90 tablet 3   No current facility-administered medications for this visit.    Socially he is married. He works as a Oceanographer. He had smoked for 40 years but quit the day of his heart attack. Mostly, he did have a history of alcohol and drug use but he quit approximately 14 years ago.  Family History  Problem Relation Age of Onset  . Heart disease Father   . Heart attack Father   . Heart disease Brother   . Heart attack Brother   . Heart disease Paternal Grandfather   . Heart attack Mother     ROS General: Negative; No fevers, chills, or night sweats; Significant fatigue and increased stress HEENT: Negative; No changes in vision or hearing, sinus congestion, difficulty swallowing Pulmonary: Negative; No cough, wheezing, shortness of breath, hemoptysis Cardiovascular: see HPI GI: Negative; No nausea, vomiting, diarrhea, or abdominal pain GU: Negative; No dysuria, hematuria, or difficulty voiding Musculoskeletal: Negative; no myalgias, joint pain, or weakness Hematologic/Oncology: Negative; no easy bruising, bleeding Endocrine: Negative; no heat/cold intolerance; no diabetes Neuro: Negative; no changes in balance, headaches Skin: Negative; No rashes or skin lesions Psychiatric: Negative; No behavioral problems, depression Sleep: Very poor sleep with loud snoring, no daytime sleepiness, hypersomnolence, bruxism, restless legs, hypnogognic hallucinations, no  cataplexy; he never pursued evaluation for sleep apnea Other comprehensive 14 point system review is negative.   PE BP 135/83   Pulse 71   Temp (!) 97.1 F (36.2 C)   Ht 6' 1.5" (1.867 m)   Wt 219 lb (99.3 kg)   SpO2 100%   BMI 28.50 kg/m     Repeat blood pressure by me was 126/78  Wt Readings from Last 3 Encounters:  03/25/19 219 lb (99.3 kg)  08/09/18  212 lb (96.2 kg)  10/07/17 219 lb 9.6 oz (99.6 kg)   General: Alert, oriented, no distress.  Skin: normal turgor, no rashes, warm and dry HEENT: Normocephalic, atraumatic. Pupils equal round and reactive to light; sclera anicteric; extraocular muscles intact; Nose without nasal septal hypertrophy Mouth/Parynx benign; Mallinpatti scale 3 Neck: No JVD, no carotid bruits; normal carotid upstroke Lungs: clear to ausculatation and percussion; no wheezing or rales Chest wall: without tenderness to palpitation Heart: PMI not displaced, RRR, s1 s2 normal, 1/6 systolic murmur, no diastolic murmur, no rubs, gallops, thrills, or heaves Abdomen: soft, nontender; no hepatosplenomehaly, BS+; abdominal aorta nontender and not dilated by palpation. Back: no CVA tenderness Pulses 2+ Musculoskeletal: full range of motion, normal strength, no joint deformities Extremities: no clubbing cyanosis or edema, Homan's sign negative  Neurologic: grossly nonfocal; Cranial nerves grossly wnl Psychologic: Normal mood and affect  ECG (independently read by me): Sinus rhythm with sinus arrhythmia with a ventricular rate at 71 bpm.  Inferior Q waves consistent with old inferior MI.  QTc interval 465 ms.  PR interval 170 ms.  July 2019 ECG (independently read by me): Sinus bradycardia 53 bpm.  Old inferior MI with Q waves in 3 and aVF.  October 2018 ECG (independently read by me): Sinus bradycardia 59 bpm.  Old inferior Q waves in leads 3 and aVF.  No significant ST-T changes.  Normal intervals.  No ectopy.  March 2018 ECG (independently read by me): Sinus  bradycardia 53 bpm.  Old inferior Q waves 3 and aVF.  Normal intervals.  No ST segment changes.  May 2017 ECG (independently read by me): Normal sinus rhythm at 64 bpm.  Borderline LVH by voltage criteria.  Old inferior MI with Q waves.  September 2016 ECG (independently read by me): Normal sinus rhythm at 67 bpm.  Q waves inferiorly, compatible with his old inferior MI.  No significant ST changes.  May 2016 ECG (independently read by me): Normal sinus rhythm at 73 bpm.  Occasional unifocal PVCs.  November 2015 ECG (independently read by me): Sinus rhythm with PVC.  Old inferior by with Q waves in leads 3 and aVF.  Mild RV conduction delay.  Prior 11/20/2012 ECG: Sinus bradycardia at 57 beats per minute. Evidence for old inferior infarct with Q wave in 3.  T-wave inversion in 3 and F and also T-wave inversion V1 through V4.   BMP Latest Ref Rng & Units 08/09/2018 03/18/2015 10/29/2014  Glucose 70 - 99 mg/dL 123(H) 87 101(H)  BUN 6 - 20 mg/dL _0 Creatinine 0.61 - 1.24 mg/dL 1.11 1.13 1.11  Sodium 135 - 145 mmol/L 138 138 138  Potassium 3.5 - 5.1 mmol/L 3.7 3.7 3.5  Chloride 98 - 111 mmol/L 107 106 108  CO2 22 - 32 mmol/L _1 Calcium 8.9 - 10.3 mg/dL 8.8(L) 9.1 8.8(L)   Hepatic Function Latest Ref Rng & Units 08/09/2018 03/18/2015 08/19/2014  Total Protein 6.5 - 8.1 g/dL 6.7 6.8 -  Albumin 3.5 - 5.0 g/dL 4.1 4.1 -  AST 15 - 41 U/L 40 19 26  ALT 0 - 44 U/L 54(H) 34 18  Alk Phosphatase 38 - 126 U/L 57 57 65  Total Bilirubin 0.3 - 1.2 mg/dL 0.6 1.0 -   CBC Latest Ref Rng & Units 08/09/2018 03/18/2015 10/29/2014  WBC 4.0 - 10.5 K/uL 3.9(L) 14.0(H) 7.2  Hemoglobin 13.0 - 17.0 g/dL 14.3 13.3 13.9  Hematocrit 39.0 - 52.0 % 42.5 39.1(L) 40.2  Platelets 150 - 400 K/uL 163 136(L) 142(L)   Lab Results  Component Value Date   TSH 2.602 08/20/2012  No results found for: HGBA1C  Lipid Panel     Component Value Date/Time   CHOL 102 08/19/2014 0000   CHOL 100 12/29/2013 0450   TRIG  104 08/19/2014 0000   TRIG 124 12/29/2013 0450   HDL 32 (A) 08/19/2014 0000   HDL 27 (L) 12/29/2013 0450   VLDL 25 12/29/2013 0450   LDLCALC 49 08/19/2014 0000   LDLCALC 48 12/29/2013 0450    RADIOLOGY: No results found.  IMPRESSION:  1. Coronary artery disease due to lipid rich plaque   2. 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 Hyperlipidemia with target LDL less than 70   4. Sinus arrhythmia   5. Gastroesophageal reflux disease without esophagitis    ASSESSMENT AND PLAN:   Mr. Comunale is a 59 year old white male who suffered an inferior wall myocardial infarction on 06/03/2012 . PCI was difficult but ultimately successful at [facility] following a failed attempt at [facility] with insertion of 2 DES Promus Premier  stents into his proximal RCA.  He has mild concomitant coronary artery disease with smooth tubular narrowing of his middle LAD of 30%.  He did not tolerate even a very low dose of  ACE inhibition and this may have been due to hypotension particularly in the setting of extreme heat and humidity while working.  As only, he denies any recurrent anginal symptomatology.  He is a has continued to be on long-term dual antiplatelet therapy with aspirin and Plavix and denies bleeding.  His last nuclear perfusion study at [facility] was unchanged and showed a small to medium size fixed defect of mild to moderate intensity in the inferior wall consistent with his prior MI.  Recently he has been taking metoprolol tartrate instead of 12.5 twice a day at 25 mg at bedtime.  His ECG today shows sinus rhythm with moderate sinus arrhythmia.  I have suggested changing metoprolol tartrate to longer acting metoprolol succinate and he will continue with 25 mg at bedtime.  He continues to be on lovastatin and fenofibrate for mixed hyperlipidemia.  He recent laboratory and he was told his lipids were excellent.  He did not recall the numbers.  He sees Dr. Francoise Schaumann at  94-monthintervals.  He had his [facility] last year requiring doxycycline therapy.  He will continue with his current medical regimen.  His GERD is controlled with pantoprazole.  I will see him in 1 year for cardiology reevaluation.  Time spent: 25 minutes TTroy Sine MD, FTotal [facility] Inc1/09/2019 5:27 PM

## 2019-03-25 NOTE — Patient Instructions (Signed)
Medication Instructions:  STOP THE METOPROLOL TARTRATE AND BEGIN METOPROLOL SUCCINATE 25MG = 1 TABLET AT BEDTIME *If you need a refill on your cardiac medications before your next appointment, please call your pharmacy*  Lab Work: If you have labs (blood work) drawn today and your tests are completely normal, you will receive your results only by: Marland Kitchen MyChart Message (if you have MyChart) OR . A paper copy in the mail If you have any lab test that is abnormal or we need to change your treatment, we will call you to review the results.    Follow-Up: At [facility], you and your health needs are our priority.  As part of our continuing mission to provide you with exceptional heart care, we have created designated Provider Care Teams.  These Care Teams include your primary Cardiologist (physician) and Advanced Practice Providers (APPs -  Physician Assistants and Nurse Practitioners) who all work together to provide you with the care you need, when you need it.  Your next appointment:   12 month(s)  The format for your next appointment:   Either In Person or Virtual  Provider:   You may see Shelva Majestic MD or one of the following Advanced Practice Providers on your designated Care Team:    Almyra Deforest, PA-C  Fabian Sharp, PA-C or   Roby Lofts, [place]

## 2019-08-11 ENCOUNTER — Other Ambulatory Visit: Payer: Self-pay

## 2019-08-11 ENCOUNTER — Telehealth: Payer: Self-pay | Admitting: Cardiovascular Disease

## 2019-08-11 MED ORDER — NITROGLYCERIN 0.4 MG SL SUBL: 0.4000 mg | SUBLINGUAL_TABLET | SUBLINGUAL | 2 refills | Status: DC | PRN

## 2019-08-11 NOTE — Telephone Encounter (Signed)
New message    Patient needs a new prescription for nitroGLYCERIN (NITROSTAT) 0.4 MG SL tablet sent to [facility]. Please call the pharmacy.

## 2019-08-13 ENCOUNTER — Emergency Department: Payer: 59

## 2019-08-13 ENCOUNTER — Other Ambulatory Visit: Payer: Self-pay

## 2019-08-13 ENCOUNTER — Encounter: Payer: Self-pay | Admitting: Emergency Medicine

## 2019-08-13 ENCOUNTER — Emergency Department
Admission: EM | Admit: 2019-08-13 | Discharge: 2019-08-13 | Disposition: A | Discharge status: Home / Self Care | Payer: 59 | Attending: Emergency Medicine | Admitting: Emergency Medicine

## 2019-08-13 DIAGNOSIS — Z5321 Procedure and treatment not carried out due to patient leaving prior to being seen by health care provider: Secondary | ICD-10-CM | POA: Diagnosis not present

## 2019-08-13 DIAGNOSIS — I252 Old myocardial infarction: Secondary | ICD-10-CM | POA: Diagnosis not present

## 2019-08-13 DIAGNOSIS — R0789 Other chest pain: Secondary | ICD-10-CM | POA: Insufficient documentation

## 2019-08-13 LAB — CBC
HCT: 41.1 % (ref 39.0–52.0)
Hemoglobin: 14.1 g/dL (ref 13.0–17.0)
MCH: 29 pg (ref 26.0–34.0)
MCHC: 34.3 g/dL (ref 30.0–36.0)
MCV: 84.4 fL (ref 80.0–100.0)
Platelets: 202 10*3/uL (ref 150–400)
RBC: 4.87 MIL/uL (ref 4.22–5.81)
RDW: 14.5 % (ref 11.5–15.5)
WBC: 7.7 10*3/uL (ref 4.0–10.5)
nRBC: 0 % (ref 0.0–0.2)

## 2019-08-13 LAB — BASIC METABOLIC PANEL
Anion gap: 9 (ref 5–15)
BUN: 14 mg/dL (ref 8–23)
CO2: 20 mmol/L — ABNORMAL LOW (ref 22–32)
Calcium: 9.4 mg/dL (ref 8.9–10.3)
Chloride: 106 mmol/L (ref 98–111)
Creatinine, Ser: 1.25 mg/dL — ABNORMAL HIGH (ref 0.61–1.24)
GFR calc Af Amer: >60 mL/min (ref >60)
GFR calc non Af Amer: >60 mL/min (ref >60)
Glucose, Bld: 97 mg/dL (ref 70–99)
Potassium: 3.8 mmol/L (ref 3.5–5.1)
Sodium: 135 mmol/L (ref 135–145)

## 2019-08-13 LAB — TROPONIN I (HIGH SENSITIVITY): Troponin I (High Sensitivity): 5 ng/L (ref <18)

## 2019-08-13 NOTE — ED Triage Notes (Signed)
Pt called for reassessment, no response 

## 2019-08-13 NOTE — ED Notes (Signed)
Pt called for reassessment, no response 

## 2019-08-13 NOTE — ED Notes (Signed)
Pt called x's 3 for reassessment and VS recheck. No rsponse

## 2019-08-13 NOTE — ED Triage Notes (Signed)
C/o intermittent left side chest pain since this AM.  Hx of MI.  Seems worse with different positions.  Did play golf for first time in while yesterday. Alert and oriented. NAD. VSS. Color WNL

## 2019-08-17 ENCOUNTER — Telehealth: Payer: Self-pay | Admitting: Emergency Medicine

## 2019-08-17 NOTE — Telephone Encounter (Signed)
Called patient due to lwot to inquire about condition and follow up plans. Says he has appt with his doctor at 3pm today.

## 2019-12-10 IMAGING — DX PORTABLE CHEST - 1 VIEW
1 series · 1 of 1 positions shown · non-contrast
Comparison: 10/29/2014

CLINICAL DATA: Shortness of breath

EXAM:
PORTABLE CHEST 1 VIEW

[chest ap]
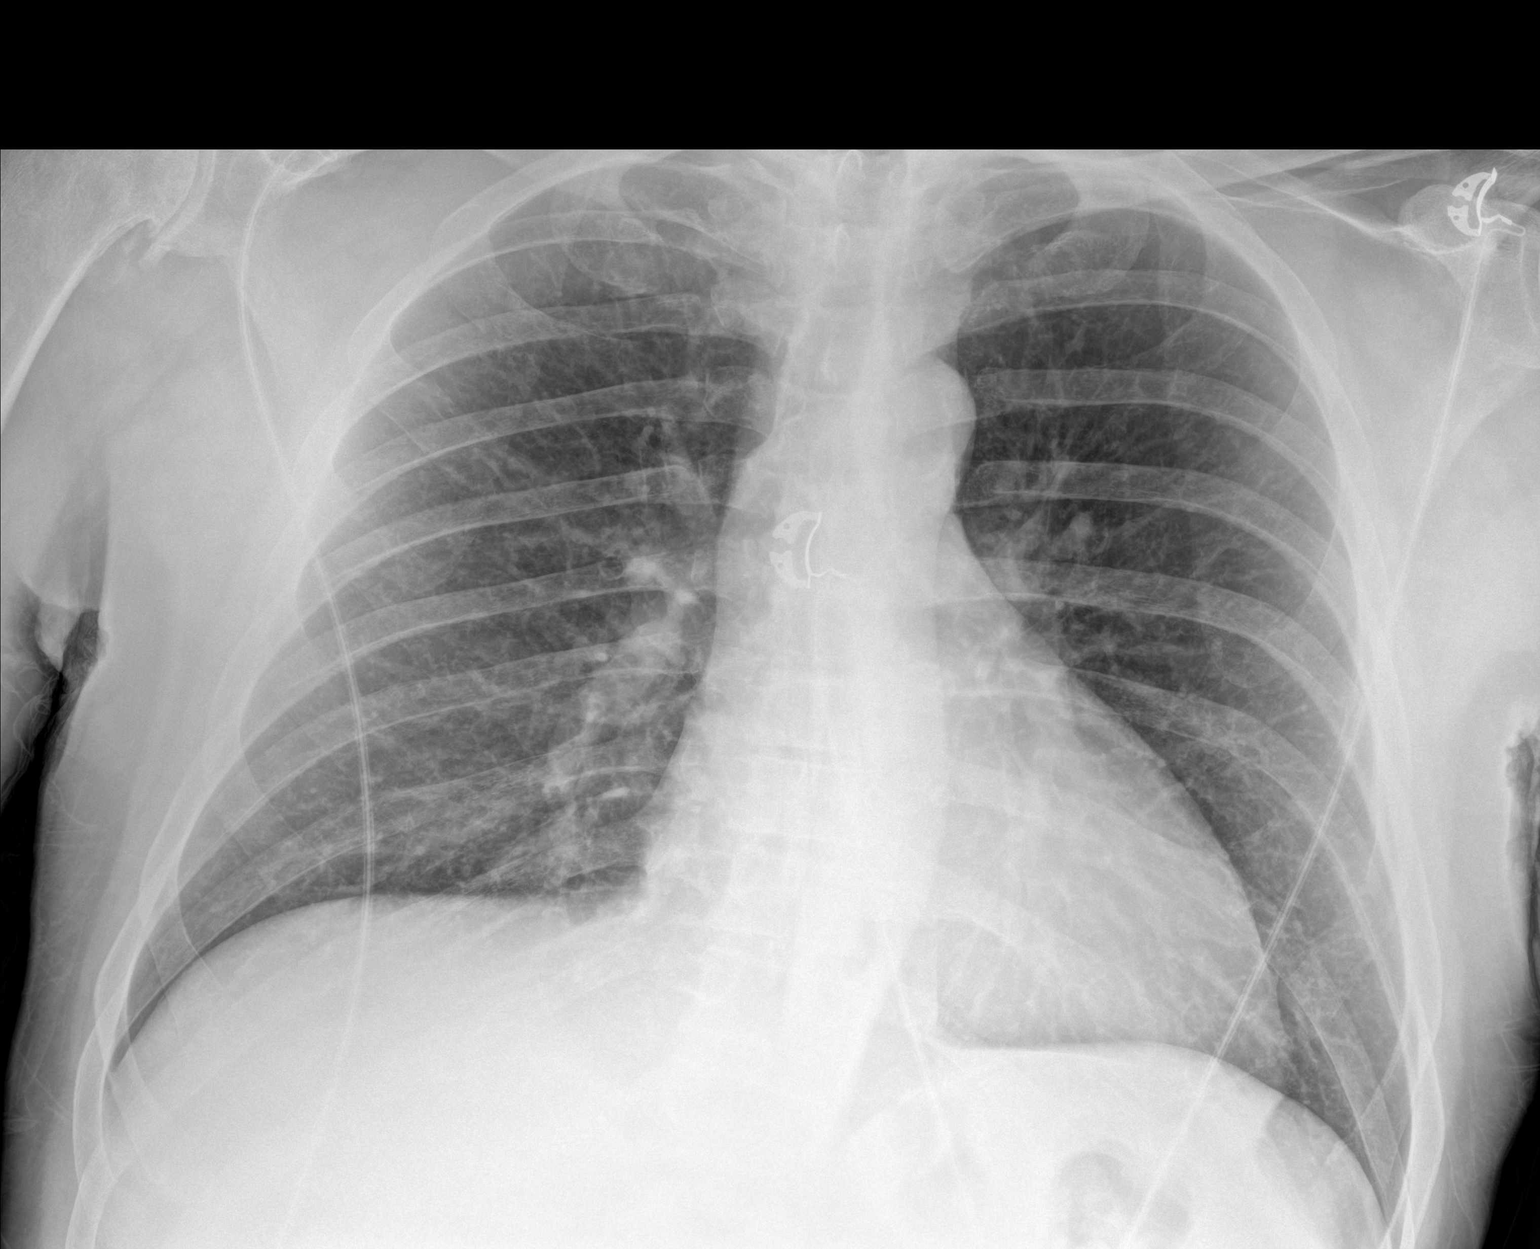

[1 of 1 positions shown; findings below may reference images not displayed]

FINDINGS: There is no focal parenchymal opacity. There is no pleural effusion
or pneumothorax. The heart and mediastinal contours are
unremarkable.

There is no acute osseous abnormality. There is severe
osteoarthritis of the right glenohumeral joint.
IMPRESSION: No active disease.

## 2020-04-14 ENCOUNTER — Other Ambulatory Visit: Payer: Self-pay | Admitting: Cardiovascular Disease

## 2020-06-04 ENCOUNTER — Emergency Department: Payer: 59

## 2020-06-04 ENCOUNTER — Other Ambulatory Visit: Payer: Self-pay

## 2020-06-04 ENCOUNTER — Emergency Department
Admission: EM | Admit: 2020-06-04 | Discharge: 2020-06-05 | Disposition: A | Discharge status: Home / Self Care | Payer: 59 | Attending: Emergency Medicine | Admitting: Emergency Medicine

## 2020-06-04 DIAGNOSIS — Z85038 Personal history of other malignant neoplasm of large intestine: Secondary | ICD-10-CM | POA: Insufficient documentation

## 2020-06-04 DIAGNOSIS — Z7982 Long term (current) use of aspirin: Secondary | ICD-10-CM | POA: Insufficient documentation

## 2020-06-04 DIAGNOSIS — R0789 Other chest pain: Secondary | ICD-10-CM | POA: Insufficient documentation

## 2020-06-04 DIAGNOSIS — R079 Chest pain, unspecified: Secondary | ICD-10-CM

## 2020-06-04 DIAGNOSIS — I2510 Atherosclerotic heart disease of native coronary artery without angina pectoris: Secondary | ICD-10-CM | POA: Insufficient documentation

## 2020-06-04 DIAGNOSIS — Z79899 Other long term (current) drug therapy: Secondary | ICD-10-CM | POA: Insufficient documentation

## 2020-06-04 DIAGNOSIS — Z87891 Personal history of nicotine dependence: Secondary | ICD-10-CM | POA: Diagnosis not present

## 2020-06-04 DIAGNOSIS — I10 Essential (primary) hypertension: Secondary | ICD-10-CM | POA: Insufficient documentation

## 2020-06-04 LAB — CBC
HCT: 42.9 % (ref 39.0–52.0)
Hemoglobin: 14.2 g/dL (ref 13.0–17.0)
MCH: 28.5 pg (ref 26.0–34.0)
MCHC: 33.1 g/dL (ref 30.0–36.0)
MCV: 86.1 fL (ref 80.0–100.0)
Platelets: 223 10*3/uL (ref 150–400)
RBC: 4.98 MIL/uL (ref 4.22–5.81)
RDW: 14.6 % (ref 11.5–15.5)
WBC: 7.9 10*3/uL (ref 4.0–10.5)
nRBC: 0 % (ref 0.0–0.2)

## 2020-06-04 LAB — BASIC METABOLIC PANEL
Anion gap: 7 (ref 5–15)
BUN: 16 mg/dL (ref 8–23)
CO2: 23 mmol/L (ref 22–32)
Calcium: 9.2 mg/dL (ref 8.9–10.3)
Chloride: 106 mmol/L (ref 98–111)
Creatinine, Ser: 1.24 mg/dL (ref 0.61–1.24)
GFR, Estimated: >60 mL/min (ref >60)
Glucose, Bld: 118 mg/dL — ABNORMAL HIGH (ref 70–99)
Potassium: 3.5 mmol/L (ref 3.5–5.1)
Sodium: 136 mmol/L (ref 135–145)

## 2020-06-04 LAB — TROPONIN I (HIGH SENSITIVITY): Troponin I (High Sensitivity): 6 ng/L (ref <18)

## 2020-06-04 NOTE — ED Triage Notes (Signed)
1st RN: Pt reports to EMS CP since 0500 today. Pt states he believes NTG was expired. Pt denies relief.

## 2020-06-04 NOTE — ED Provider Notes (Signed)
[facility] Emergency Department Provider Note   ____________________________________________   Event Date/Time   First MD Initiated Contact with Patient 06/04/20 2053     (approximate)  I have reviewed the triage vital signs and the nursing notes.   HISTORY  Chief Complaint Chest Pain    HPI A 60 year old patient with a history of hypertension presents for evaluation of chest pain. Initial onset of pain was approximately 1-3 hours ago. The patient's chest pain is described as heaviness/pressure/tightness, is worse with exertion and is relieved by nitroglycerin. The patient's chest pain is middle- or left-sided, is not well-localized, is not sharp and does not radiate to the arms/jaw/neck. The patient does not complain of nausea and denies diaphoresis. The patient has no history of stroke, has no history of peripheral artery disease, has not smoked in the past 90 days, denies any history of treated diabetes, has no relevant family history of coronary artery disease (first degree relative at less than age 44), has no history of hypercholesterolemia and does not have an elevated BMI (>=30).   Patient reports that chest discomfort felt like a strong tightness that has now completely resolved.  He does have a history of heart disease, last saw his cardiologist about a year ago he is due for appointment in April.  He did take 4 baby aspirin at home and his chest pain was relieved thereafter  Currently pain and symptom-free.  Past Medical History:  Diagnosis Date  . Anxiety    on chlorazepate, no preventative  . Bone spur of left foot    Heel  . Coronary artery disease 05/2012   MI tx. @ [facility] 2 stents placed   . Diverticulitis   . GERD (gastroesophageal reflux disease)   . Hypercholesteremia   . Laryngopharyngeal reflux (LPR)   . LV dysfunction 07/26/12   EF 50%  . MI, old   . Mild pulmonic regurgitation and RV dysfunction by prior echocardiogram 07/26/12  .  S/P cardiac cath 07/28/12   Normal L main, Small ramus, L circumflex: small OM1, large branching OM2, small OM3; LAD: Tiny D1, tiny D2, Tiny D3, long tubular 30% stenosis in mid LAD; RCA: patent stents in proximal RCA, normal PDA, 2 small posterolateral branches    Patient Active Problem List   Diagnosis Date Noted  . Fatigue 07/25/2015  . Special screening for malignant neoplasms, colon   . Benign neoplasm of sigmoid colon   . Solitary pulmonary nodule on lung CT 04/12/2015  . Atherosclerosis 04/12/2015  . Hiatal hernia 04/12/2015  . Diverticulitis of sigmoid colon 04/12/2015  . Atypical chest pain 12/14/2014  . Old MI (myocardial infarction) 12/14/2014  . Environmental and seasonal allergies 08/16/2014  . PVC's (premature ventricular contractions) 08/16/2014  . CAD (coronary artery disease) 02/09/2014  . Evaluate for Sleep apnea 07/04/2013  . Acute MI, inferior wall, subsequent episode of care ([place]) 08/19/2012  . Hyperlipidemia with target LDL less than 70 08/19/2012  . Tobacco use 08/19/2012    Past Surgical History:  Procedure Laterality Date  . APPENDECTOMY  1972  . CARDIAC CATHETERIZATION     3/14(x2), 5/14  . CARDIAC SURGERY    . COLONOSCOPY WITH PROPOFOL N/A 04/28/2015   Procedure: COLONOSCOPY WITH PROPOFOL;  Surgeon: Lucilla Lame, MD;  Location: [place];  Service: Endoscopy;  Laterality: N/A;  . POLYPECTOMY  04/28/2015   Procedure: POLYPECTOMY;  Surgeon: Lucilla Lame, MD;  Location: [place];  Service: Endoscopy;;  . rt. ACL surgery  1993   [facility]    Prior to Admission medications   Medication Sig Start Date End Date Taking? Authorizing Provider  aspirin 81 MG tablet Take 81 mg by mouth daily.    [provider]  atorvastatin (LIPITOR) 20 MG tablet Take 1 tablet (20 mg total) by mouth daily. 07/04/15   Park Liter P, DO  clopidogrel (PLAVIX) 75 MG tablet Take 75 mg by mouth daily.    [provider]  clorazepate  (TRANXENE) 7.5 MG tablet Take 7.5 mg by mouth 2 (two) times daily as needed for anxiety. prn    [provider]  fenofibrate (TRICOR) 145 MG tablet Take 145 mg by mouth daily.    [provider]  metoprolol succinate (TOPROL-XL) 25 MG 24 hr tablet TAKE 1 TABLET BY MOUTH EVERY NIGHT AT. BEDTIME. TAKE WITH OR IMMEDIATELY FOLLOWING A MEAL 04/14/20   Troy Sine, MD  nitroGLYCERIN (NITROSTAT) 0.4 MG SL tablet Place 1 tablet (0.4 mg total) under the tongue every 5 (five) minutes as needed for chest pain. 08/11/19   Troy Sine, MD  pantoprazole (PROTONIX) 40 MG tablet Take 1 tablet (40 mg total) by mouth daily. 06/28/15   Johnson, Megan P, DO  zolpidem (AMBIEN) 10 MG tablet Take 10 mg by mouth at bedtime as needed.  03/30/15   [provider]    Allergies Lisinopril and Beta adrenergic blockers  Family History  Problem Relation Age of Onset  . Heart disease Father   . Heart attack Father   . Heart disease Brother   . Heart attack Brother   . Heart disease Paternal Grandfather   . Heart attack Mother     Social History Social History   Tobacco Use  . Smoking status: Former Smoker    Packs/day: 1.00    Years: 35.00    Pack years: 35.00    Types: Cigarettes    Quit date: 06/03/2012    Years since quitting: 8.0  . Smokeless tobacco: Current User    Types: Snuff  . Tobacco comment: 1/2 can/day  Vaping Use  . Vaping Use: Never used  Substance Use Topics  . Alcohol use: No  . Drug use: No    Review of Systems Constitutional: No fever/chills Eyes: No visual changes. ENT: No sore throat. Cardiovascular: See HPI Respiratory: Denies shortness of breath. Gastrointestinal: No abdominal pain.   Genitourinary: Negative for dysuria. Musculoskeletal: Negative for back pain. Neurological: Negative for headache.  To continue nitroglycerin tablets as well, does not think they were effective because they are out of  date    ____________________________________________   PHYSICAL EXAM:  VITAL SIGNS: ED Triage Vitals  Enc Vitals Group     BP 06/04/20 2046 (!) 141/85     Pulse Rate 06/04/20 2046 65     Resp 06/04/20 2046 16     Temp --      Temp src --      SpO2 06/04/20 2026 100 %     Weight 06/04/20 2059 210 lb (95.3 kg)     Height 06/04/20 2059 6\' 1"  (1.854 m)     Head Circumference --      Peak Flow --      Pain Score --      Pain Loc --      Pain Edu? --      Excl. in [place]? --     Constitutional: Alert and oriented. Well appearing and in no acute distress. Eyes: Conjunctivae are normal.  Head: Atraumatic. Nose: No congestion/rhinnorhea. Mouth/Throat: Mucous membranes are moist. Neck: No stridor.  Cardiovascular: Normal rate, regular rhythm. Grossly normal heart sounds.  Good peripheral circulation. Respiratory: Normal respiratory effort.  No retractions. Lungs CTAB. Gastrointestinal: Soft and nontender. No distention. Musculoskeletal: No lower extremity tenderness nor edema. Neurologic:  Normal speech and language. No gross focal neurologic deficits are appreciated.  Skin:  Skin is warm, dry and intact. No rash noted. Psychiatric: Mood and affect are normal. Speech and behavior are normal.  ____________________________________________   LABS (all labs ordered are listed, but only abnormal results are displayed)  Labs Reviewed  BASIC METABOLIC PANEL - Abnormal; Notable for the following components:      Result Value   Glucose, Bld 118 (*)    All other components within normal limits  CBC  TROPONIN I (HIGH SENSITIVITY)   ____________________________________________  EKG  Reviewed interpreted by me at 2040 Heart rate 65 QRS 100 QTc 440 Normal sinus rhythm, incomplete right bundle branch block.  I do not see evidence of acute new ischemia, evidence of old inferior and anterior infarct are noted.... Though there is a slight biphasic appearance V3,  nonspecific ____________________________________________  RADIOLOGY  DG Chest 1 View  Result Date: 06/04/2020 CLINICAL DATA:  60 year old male with chest pain. EXAM: CHEST  1 VIEW COMPARISON:  Chest radiograph dated 08/13/2019. FINDINGS: No focal consolidation, pleural effusion or pneumothorax. The cardiac silhouette is within limits. No acute osseous pathology. Degenerative changes of the right shoulder. IMPRESSION: No active disease. Electronically Signed   By: Anner Crete M.D.   On: 06/04/2020 21:02     ____________________________________________   PROCEDURES  Procedure(s) performed: None  Procedures  Critical Care performed: No  ____________________________________________   INITIAL IMPRESSION / ASSESSMENT AND PLAN / ED COURSE  Pertinent labs & imaging results that were available during my care of the patient were reviewed by me and considered in my medical decision making (see chart for details).   Differential diagnosis includes, but is not limited to, ACS, aortic dissection, pulmonary embolism, cardiac tamponade, pneumothorax, pneumonia, pericarditis, myocarditis, GI-related causes including esophagitis/gastritis, and musculoskeletal chest wall pain.    Patient's pain and symptoms completely relieved now after taking nitro and aspirin at home.  Does have a history of known coronary disease and stenting.  His symptoms do sound potentially typical of ACS, his first troponin however is normal and his EKG does not demonstrate obvious ischemia.  He is felt to have moderate risk for coronary disease.  But thus far ruling out for acute MI.  Chest x-ray reassuring.  No evidence of pneumothorax.  No pleuritic pain, I do not see signs or symptoms suggest pulmonary embolism.  No associate abdominal pain.  Reassuring vascular exam  No pleuritic pain, no hypoxia or tachycardia.  Symptoms of chest tightness are now completely resolved which is reassuring.  First troponin has resulted  and is normal  ----------------------------------------- 10:18 PM on 06/04/2020 -----------------------------------------  Engaged in shared medical decision making with patient and his son, patient would strongly preference to be able to go home if a second troponin is normal and return to the ER if chest pain or new concerns arise, with a plan also in place for close cardiology follow-up.  I have called cardiology discussed with Dr. Garen Lah, and patient will be able to receive follow-up within the next 1 to 2 days through the cardiology office.  I also placed a referral and sent a message to Dr. Claiborne Billings requesting close follow-up should  he be discharged  Patient declined offer for overnight observation for chest pain observation and to "rule out a heart attack".  Based on his heart score, if his second troponin is normal he appears appropriate for close outpatient follow-up especially given his preference not to stay for overnight observation which I think is reasonable now that he is asymptomatic with reassuring initial biomarker, second troponin however is pending   Ongoing care assigned to Dr. Karma Greaser, follow-up on repeat troponin.  If normal would anticipate plan for discharge to follow-up closely with cardiology      ____________________________________________   FINAL CLINICAL IMPRESSION(S) / ED DIAGNOSES  Final diagnoses:  Chest pain, moderate coronary artery risk        Note:  This document was prepared using Dragon voice recognition software and may include unintentional dictation errors       Delman Kitten, MD 06/04/20 2335

## 2020-06-04 NOTE — ED Triage Notes (Signed)
Pt with central chest pain since this am. Pt states has felt shob. Pt with previous MI. Pt states pain began at 0530 this am. Pt took 324mg  of asa. Pt very upset, using strong language and raised voice.

## 2020-06-04 NOTE — Discharge Instructions (Addendum)

## 2020-06-04 NOTE — ED Provider Notes (Signed)
-----------------------------------------   10:59 PM on 06/04/2020 -----------------------------------------  Assuming care from Dr. Jacqualine Code.  In short, Aaron Kelley is a 60 y.o. male with a chief complaint of chest pain.  Refer to the original H&P for additional details.  The current plan of care is to follow-up on the repeat troponin and reassess.    ----------------------------------------- 12:47 AM on 06/05/2020 -----------------------------------------  Repeat troponin is unchanged and reassuring.  Patient is very eager to go home and is not having chest pain at this time.  He knows the importance of following up with his doctor, Dr. Claiborne Billings, at the next available opportunity.  He knows to return to the emergency department with new or worsening symptoms.   Hinda Kehr, MD 06/05/20 [phone]

## 2020-06-05 LAB — TROPONIN I (HIGH SENSITIVITY): Troponin I (High Sensitivity): 6 ng/L

## 2020-06-05 NOTE — ED Notes (Signed)
ED Provider at bedside. 

## 2020-06-05 NOTE — ED Notes (Signed)
Pt refused vitals and requested all monitoring equipment be removed. Pt States "It's been an hour and a half why haven"t I got my results". Pt's Daughter than states "We're leaving come unhook him. I work for [employer] I know how long a STAT lab is supposed to take." MD notified.

## 2020-06-09 ENCOUNTER — Encounter: Payer: Self-pay | Admitting: Cardiovascular Disease

## 2020-06-09 ENCOUNTER — Ambulatory Visit: Payer: 59 | Admitting: Cardiovascular Disease

## 2020-06-09 ENCOUNTER — Other Ambulatory Visit: Payer: Self-pay

## 2020-06-09 DIAGNOSIS — I252 Old myocardial infarction: Secondary | ICD-10-CM

## 2020-06-09 DIAGNOSIS — I2510 Atherosclerotic heart disease of native coronary artery without angina pectoris: Secondary | ICD-10-CM

## 2020-06-09 DIAGNOSIS — R072 Precordial pain: Secondary | ICD-10-CM | POA: Diagnosis not present

## 2020-06-09 DIAGNOSIS — E785 Hyperlipidemia, unspecified: Secondary | ICD-10-CM

## 2020-06-09 DIAGNOSIS — Z720 Tobacco use: Secondary | ICD-10-CM

## 2020-06-09 DIAGNOSIS — Z7189 Other specified counseling: Secondary | ICD-10-CM

## 2020-06-09 NOTE — Patient Instructions (Signed)
Medication Instructions:  Continue current medications  *If you need a refill on your cardiac medications before your next appointment, please call your pharmacy*   Lab Work: None Ordered  Testing/Procedures: Your physician has requested that you have a lexiscan myoview. For further information please visit [email or web address]. Please follow instruction sheet, as given.  Follow-Up: At [facility], you and your health needs are our priority.  As part of our continuing mission to provide you with exceptional heart care, we have created designated Provider Care Teams.  These Care Teams include your primary Cardiologist (physician) and Advanced Practice Providers (APPs -  Physician Assistants and Nurse Practitioners) who all work together to provide you with the care you need, when you need it.  We recommend signing up for the patient portal called "MyChart".  Sign up information is provided on this After Visit Summary.  MyChart is used to connect with patients for Virtual Visits (Telemedicine).  Patients are able to view lab/test results, encounter notes, upcoming appointments, etc.  Non-urgent messages can be sent to your provider as well.   To learn more about what you can do with MyChart, go to [email or web address].    Your next appointment:   3 month(s)  The format for your next appointment:   In Person  Provider:   You may see Shelva Majestic, MD or one of the following Advanced Practice Providers on your designated Care Team:    Almyra Deforest, PA-C  Fabian Sharp, PA-C or   Roby Lofts, [place]

## 2020-06-09 NOTE — Progress Notes (Signed)
Patient ID: Aaron Kelley, male   DOB: [birth year], 60 y.o.   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  Aaron Kelley has a history of CAD and developed recurrent episodes of chest discomfort associated with significant shortness of breath while at work on 06/03/2012.  He presented to [facility] and initial catheterization was done acutely by [name].  An attempted intervention to his subtotally occluded RCA was unsuccessful and the patient was  transported to [facility] and subsequently underwent successful intervention with placement of two 3.0x24 mm Promus premier drug-eluting stents. He was discharged on aspirin, atorvastatin 80 mg, and Lopressor but he develop significant issues with  fatigability. He saw the physician assistant on several occasions at [facility] and ultimately he was taken off his beta blocker therapy. An echo Doppler study on Jul 23, 2012  showed an ejection fraction of approximately 50% with inferior hypocontractility, as well as mild concentric LVH and grade 1 diastolic dysfunction. Several days later on Jul 28, 2012 due to  recurrent chest discomfort and T-wave changes he underwent repeat cardiac catheterization at [facility]. This showed patent stents in the proximal RCA. He did have a long tubular 30% stenosis in the mid LAD. Other vessels were normal. The patient had subsequently seen Dr. [name] and was referred to me for subsequent post-MI management and care.  When I initially saw him on August 19, 2012 I  recommended an initial trial of ACE inhibitor and started him on low-dose lisinopril. He could not tolerate this small dose and experienced some recurrent chest pain. An NMR  lipoprofile  revealed a calculated LDL of 41 with an LDL particle number of 711. HDL cholesterol was low at 34 and his HDL particle number remains low at 24.9. Triglycerides  are excellent at 80. Insulin resistance was slightly elevated at 52. Hemoglobin was 14.1 hematocrit 41.1 BUN 12 a 1.12. Fasting glucose mildly elevated at 123. I also performed a  P2Y12 assessment of platelet function since he was on Plavix and this revealed adequate platelet response with a value of 191. Vitamin D. level was normal at 50. TSH was 2.602.  A nuclear perfusion study on 05/18/2013 revealed good exercise capacity without chest pain or ECG changes was felt to be low risk with a small area of fixed inferobasal defect, suggesting of a small scar versus diaphragmatic attenuation.  There was no ischemia.  Bood work from 05/10/2013 done by Dr. [name]: Total cholesterol was 115, LDL cholesterol 54, HDL cholesterol, mildly low at 37, and triglycerides of 118.  When I saw him I was concerned about obstructive sleep apnea. His wife commented that he was experiencing very loud snoring.  He admitted that his sleep wa unbearable.  I strongly recommended a sleep evaluation but he has since still not pursued this.  He  developed an episode of chest pain while at work after he was extremely fatigued following a stomach virus which had resulted in significant diarrhea and vomiting leading to dehydration.  He was evaluated at [facility] where he was admitted overnight.  Enzymes were negative.  He subsequent underwent another nuclear perfusion study on 12/29/2013.  He exercised for 8 minutes and 30 seconds and achieved a 10 minute workload and peak heart rate of 148 bpm.  Scintigraphic images again showed a small to medium-sized mild to  moderate intensity fixed perfusion defect in the basal to mid inferior wall consistent with his known prior inferior MI.  Last year he was evaluated in the emergency room with a different chest pain that was more sharp.  Troponin was negative.  He was given a dose of isosorbide 30 mg but has not been on this regularly.  He denies recurrent chest pain  of any similar characteristic to his prior heart attack.  He has been working outside over the summer and heat has led to significant fatigue.  He is unaware of palpitations.  He has continued to be under increased stress.  His mother had passed away.  In addition, his father died in March 15, 2016.  His 43 year old son has developed a large tumor on his sciatic nerve and has undergone numerous neurologic evaluations and was referred to the [facility].    He established primary care with Dr. Haskell Riling after Dr. [name] death and subsequently Dr. Jerene Dilling retirement.  He is being monitored for thyroid growth.  He denies any episodes of chest pain or shortness of breath.  He continues to work in [employer] and has had some issues with the extreme heat over the past several weeks.  He denies any episodes of chest pain.  He denies palpitations.  Laboratory by Dr. Francoise Schaumann had shown elevation of triglycerides at 291 and for this reason he was started on fenofibrate to take in addition to atorvastatin.  Most recent laboratory has shown improvement with triglycerides at 95.  His  LDL cholesterol was 60 in May 2019 with a total cholesterol of 118.    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  He developed another episode of [place] spotted fever leading to [facility] ER evaluation in May 2020 for which he was treated with doxycycline.  He sees Dr. Francoise Schaumann every 3 months.  He recently had complete set of laboratory and was told that his labs were excellent although glucose was minimally increased at 102.  He has continued to take atorvastatin 20 mg and fenofibrate 145 mg for his mixed hyperlipidemia.  He was supposed to be taking metoprolol tartrate 12.5 mg twice a day but apparently has been taking 25 mg at bedtime.  He denies any anginal symptoms.  He continues to be on aspirin and Plavix.  He denies bleeding.  He  has GERD currently controlled with pantoprazole.  He denied, palpitations, presyncope or syncope.    Presently, he admits that he has been under some increased stress.  He has been caring for his grandson for the past 2-1/2 years.  He has continued to smoke cigarettes at approximately 1/2 pack/day but also dips tobacco.  He has experienced episodes of chest pain which are not typically exertional but can last sometimes all day.  He was evaluated at [facility] ER.  He is unvaccinated with reference to COVID-19 and has no intention to receive any vaccinations.  He is continued to be on aspirin and Plavix for antiplatelet therapy.  He is on atorvastatin 20 mg and fenofibrate for mixed hyperlipidemia.  He has been on metoprolol succinate 25 mg at bedtime.  He has GERD on Protonix.  He presents for evaluation.  Past Medical History:  Diagnosis Date  . Anxiety    on chlorazepate, no preventative  . Bone spur of left foot    Heel  . Coronary artery disease  05/2012   MI tx. @ [facility] 2 stents placed   . Diverticulitis   . GERD (gastroesophageal reflux disease)   . Hypercholesteremia   . Laryngopharyngeal reflux (LPR)   . LV dysfunction 07/26/12   EF 50%  . MI, old   . Mild pulmonic regurgitation and RV dysfunction by prior echocardiogram 07/26/12  . S/P cardiac cath 07/28/12   Normal L main, Small ramus, L circumflex: small OM1, large branching OM2, small OM3; LAD: Tiny D1, tiny D2, Tiny D3, long tubular 30% stenosis in mid LAD; RCA: patent stents in proximal RCA, normal PDA, 2 small posterolateral branches    Past Surgical History:  Procedure Laterality Date  . APPENDECTOMY  1972  . CARDIAC CATHETERIZATION     3/14(x2), 5/14  . CARDIAC SURGERY    . COLONOSCOPY WITH PROPOFOL N/A 04/28/2015   Procedure: COLONOSCOPY WITH PROPOFOL;  Surgeon: Lucilla Lame, MD;  Location: [place];  Service: Endoscopy;  Laterality: N/A;  . POLYPECTOMY  04/28/2015   Procedure: POLYPECTOMY;  Surgeon: Lucilla Lame, MD;  Location: [facility];  Service: Endoscopy;;  . rt. ACL surgery  1993   [facility]    Allergies  Allergen Reactions  . Lisinopril Other (See Comments)    Severe chest pain and fatigue  . Beta Adrenergic Blockers Other (See Comments)    Hypotension    Current Outpatient Medications  Medication Sig Dispense Refill  . aspirin 81 MG tablet Take 81 mg by mouth daily.    Marland Kitchen atorvastatin (LIPITOR) 20 MG tablet Take 1 tablet (20 mg total) by mouth daily. 90 tablet 1  . clopidogrel (PLAVIX) 75 MG tablet Take 75 mg by mouth daily.    . clorazepate (TRANXENE) 7.5 MG tablet Take 7.5 mg by mouth 2 (two) times daily as needed for anxiety. prn    . fenofibrate (TRICOR) 145 MG tablet Take 145 mg by mouth daily.    . metoprolol succinate (TOPROL-XL) 25 MG 24 hr tablet TAKE 1 TABLET BY MOUTH EVERY NIGHT AT. BEDTIME. TAKE WITH OR IMMEDIATELY FOLLOWING A MEAL 60 tablet 1  . nitroGLYCERIN (NITROSTAT) 0.4 MG SL tablet Place 1 tablet (0.4 mg total) under the tongue every 5 (five) minutes as needed for chest pain. 45 tablet 2  . pantoprazole (PROTONIX) 40 MG tablet Take 1 tablet (40 mg total) by mouth daily. 30 tablet 3  . zolpidem (AMBIEN) 10 MG tablet Take 10 mg by mouth at bedtime as needed.      No current facility-administered medications for this visit.    Socially he is married. He works as a Oceanographer. He had smoked for 40 years but quit the day of his heart attack. Mostly, he did have a history of alcohol and drug use but he quit approximately 14 years ago.  Family History  Problem Relation Age of Onset  . Heart disease Father   . Heart attack Father   . Heart disease Brother   . Heart attack Brother   . Heart disease Paternal Grandfather   . Heart attack Mother     ROS General: Negative; No fevers, chills, or night sweats; Significant fatigue and increased stress HEENT: Negative; No changes in vision or hearing, sinus congestion, difficulty swallowing Pulmonary:  Negative; No cough, wheezing, shortness of breath, hemoptysis Cardiovascular: see HPI GI: Negative; No nausea, vomiting, diarrhea, or abdominal pain GU: Negative; No dysuria, hematuria, or difficulty voiding Musculoskeletal: Negative; no myalgias, joint pain, or weakness Hematologic/Oncology: Negative; no easy bruising, bleeding Endocrine: Negative;  no heat/cold intolerance; no diabetes Neuro: Negative; no changes in balance, headaches Skin: Negative; No rashes or skin lesions Psychiatric: Negative; No behavioral problems, depression Sleep: Very poor sleep with loud snoring, no daytime sleepiness, hypersomnolence, bruxism, restless legs, hypnogognic hallucinations, no cataplexy; he never pursued evaluation for sleep apnea Other comprehensive 14 point system review is negative.   PE BP 131/77   Pulse 67   Ht '6\' 1"'  (1.854 m)   Wt 213 lb 3.2 oz (96.7 kg)   SpO2 97%   BMI 28.13 kg/m     Repeat blood pressure by me was 136/78  Wt Readings from Last 3 Encounters:  06/09/20 213 lb 3.2 oz (96.7 kg)  06/04/20 210 lb (95.3 kg)  03/25/19 219 lb (99.3 kg)   General: Alert, oriented, no distress.  Skin: normal turgor, no rashes, warm and dry HEENT: Normocephalic, atraumatic. Pupils equal round and reactive to light; sclera anicteric; extraocular muscles intact; Nose without nasal septal hypertrophy Mouth/Parynx benign; Mallinpatti scale 3 Neck: No JVD, no carotid bruits; normal carotid upstroke Lungs: clear to ausculatation and percussion; no wheezing or rales Chest wall: without tenderness to palpitation Heart: PMI not displaced, RRR, s1 s2 normal, 1/6 systolic murmur, no diastolic murmur, no rubs, gallops, thrills, or heaves Abdomen: soft, nontender; no hepatosplenomehaly, BS+; abdominal aorta nontender and not dilated by palpation. Back: no CVA tenderness Pulses 2+ Musculoskeletal: full range of motion, normal strength, no joint deformities Extremities: no clubbing cyanosis or edema,  Homan's sign negative  Neurologic: grossly nonfocal; Cranial nerves grossly wnl Psychologic: Normal mood and affect   ECG (independently read by me): Sinus rhythm , PVcs ,old Inferior MI; QTc 477  January 2021 ECG (independently read by me): Sinus rhythm with sinus arrhythmia with a ventricular rate at 71 bpm.  Inferior Q waves consistent with old inferior MI.  QTc interval 465 ms.  PR interval 170 ms.  July 2019 ECG (independently read by me): Sinus bradycardia 53 bpm.  Old inferior MI with Q waves in 3 and aVF.  October 2018 ECG (independently read by me): Sinus bradycardia 59 bpm.  Old inferior Q waves in leads 3 and aVF.  No significant ST-T changes.  Normal intervals.  No ectopy.  March 2018 ECG (independently read by me): Sinus bradycardia 53 bpm.  Old inferior Q waves 3 and aVF.  Normal intervals.  No ST segment changes.  May 2017 ECG (independently read by me): Normal sinus rhythm at 64 bpm.  Borderline LVH by voltage criteria.  Old inferior MI with Q waves.  September 2016 ECG (independently read by me): Normal sinus rhythm at 67 bpm.  Q waves inferiorly, compatible with his old inferior MI.  No significant ST changes.  May 2016 ECG (independently read by me): Normal sinus rhythm at 73 bpm.  Occasional unifocal PVCs.  November 2015 ECG (independently read by me): Sinus rhythm with PVC.  Old inferior by with Q waves in leads 3 and aVF.  Mild RV conduction delay.  Prior 11/20/2012 ECG: Sinus bradycardia at 57 beats per minute. Evidence for old inferior infarct with Q wave in 3.  T-wave inversion in 3 and F and also T-wave inversion V1 through V4.   BMP Latest Ref Rng & Units 06/04/2020 08/13/2019 08/09/2018  Glucose 70 - 99 mg/dL 118(H) 97 123(H)  BUN 8 - 23 mg/dL '16 14 13  ' Creatinine 0.61 - 1.24 mg/dL 1.24 1.25(H) 1.11  Sodium 135 - 145 mmol/L 136 135 138  Potassium 3.5 - 5.1 mmol/L 3.5  3.8 3.7  Chloride 98 - 111 mmol/L 106 106 107  CO2 22 - 32 mmol/L 23 20(L) 24  Calcium 8.9 -  10.3 mg/dL 9.2 9.4 8.8(L)   Hepatic Function Latest Ref Rng & Units 08/09/2018 03/18/2015 08/19/2014  Total Protein 6.5 - 8.1 g/dL 6.7 6.8 -  Albumin 3.5 - 5.0 g/dL 4.1 4.1 -  AST 15 - 41 U/L 40 19 26  ALT 0 - 44 U/L 54(H) 34 18  Alk Phosphatase 38 - 126 U/L 57 57 65  Total Bilirubin 0.3 - 1.2 mg/dL 0.6 1.0 -   CBC Latest Ref Rng & Units 06/04/2020 08/13/2019 08/09/2018  WBC 4.0 - 10.5 K/uL 7.9 7.7 3.9(L)  Hemoglobin 13.0 - 17.0 g/dL 14.2 14.1 14.3  Hematocrit 39.0 - 52.0 % 42.9 41.1 42.5  Platelets 150 - 400 K/uL 223 202 163   Lab Results  Component Value Date   TSH 2.602 08/20/2012  No results found for: HGBA1C  Lipid Panel     Component Value Date/Time   CHOL 102 08/19/2014 0000   CHOL 100 12/29/2013 0450   TRIG 104 08/19/2014 0000   TRIG 124 12/29/2013 0450   HDL 32 (A) 08/19/2014 0000   HDL 27 (L) 12/29/2013 0450   VLDL 25 12/29/2013 0450   LDLCALC 49 08/19/2014 0000   LDLCALC 48 12/29/2013 0450    RADIOLOGY: No results found.  IMPRESSION:  1. Precordial pain   2. Coronary artery disease involving native coronary artery of native heart without angina pectoris   3. 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 Hyperlipidemia with target LDL less than 70   5. Tobacco abuse   6. Educated about COVID-19 virus infection    ASSESSMENT AND PLAN:   Aaron Kelley is a 60 year old white male who suffered an inferior wall myocardial infarction on 06/03/2012 . PCI was difficult but ultimately successful at [facility] following a failed attempt at [facility] with insertion of 2 DES Promus Premier  stents into his proximal RCA.  He has mild concomitant coronary artery disease with smooth tubular narrowing of his middle LAD of 30%.  He did not tolerate even a very low dose of  ACE inhibition and this may have been due to hypotension particularly in the setting of extreme heat and humidity while working.  Recently, he has experienced some episodes of chest  discomfort.  His chest pain is not typically exertionally precipitated.  Unfortunately, he has continued to smoke cigarettes as well as dip tobacco.  He has been under some increased stress and has been caring for his grandson for the past 2-1/2 years.  Apparently the father now expects the grandson to go back to him and this is created some significant family stress.  With his ongoing tobacco use and CAD I am recommending he have a [place] study for assessment of scar/ischemia.  I again discussed the importance of smoking cessation.  He sees Dr. Francoise Schaumann who checks his blood work and labs will be obtained next week.  I discussed with him that target LDL is less than 70 and if he is not there further medication titration will be necessary.  I discussed the importance of reconsideration of vaccination due to increased Covid illness in unvaccinated patients but he is committed that he will not get a vaccine.  I will see him in 3 months for reevaluation  Troy Sine, MD, [facility] 06/11/2020 4:56 PM

## 2020-06-11 ENCOUNTER — Encounter: Payer: Self-pay | Admitting: Cardiovascular Disease

## 2020-06-13 ENCOUNTER — Telehealth (HOSPITAL_COMMUNITY): Payer: Self-pay | Admitting: *Deleted

## 2020-06-13 NOTE — Telephone Encounter (Signed)
Close encounter 

## 2020-06-14 ENCOUNTER — Ambulatory Visit (HOSPITAL_COMMUNITY)
Admission: RE | Admit: 2020-06-14 | Discharge: 2020-06-14 | Disposition: A | Discharge status: Home / Self Care | Payer: 59 | Source: Ambulatory Visit | Attending: Cardiovascular Disease | Admitting: Cardiovascular Disease

## 2020-06-14 ENCOUNTER — Other Ambulatory Visit: Payer: Self-pay

## 2020-06-14 DIAGNOSIS — R072 Precordial pain: Secondary | ICD-10-CM | POA: Diagnosis present

## 2020-06-14 DIAGNOSIS — I2510 Atherosclerotic heart disease of native coronary artery without angina pectoris: Secondary | ICD-10-CM | POA: Insufficient documentation

## 2020-06-14 LAB — MYOCARDIAL PERFUSION IMAGING
LV dias vol: 154 mL (ref 62–150)
LV sys vol: 82 mL
Peak HR: 82 {beats}/min
Rest HR: 53 {beats}/min
SDS: 3
SRS: 7
SSS: 10
TID: 1.02

## 2020-06-14 MED ORDER — TECHNETIUM TC 99M TETROFOSMIN IV KIT
31.2000 | PACK | Freq: Once | INTRAVENOUS | Status: AC | PRN
  Administered 2020-06-14: 31.2 via INTRAVENOUS
  Filled 2020-06-14: qty 32

## 2020-06-14 MED ORDER — TECHNETIUM TC 99M TETROFOSMIN IV KIT
10.6000 | PACK | Freq: Once | INTRAVENOUS | Status: AC | PRN
  Administered 2020-06-14: 10.6 via INTRAVENOUS
  Filled 2020-06-14: qty 11

## 2020-06-14 MED ORDER — REGADENOSON 0.4 MG/5ML IV SOLN
0.4000 mg | Freq: Once | INTRAVENOUS | Status: AC
  Administered 2020-06-14: 0.4 mg via INTRAVENOUS

## 2020-06-20 ENCOUNTER — Telehealth: Payer: Self-pay | Admitting: Cardiovascular Disease

## 2020-06-20 NOTE — Telephone Encounter (Signed)
Troy Sine, MD  06/18/2020 9:26 PM EDT      Intermediate [place] Myoview study with EF at 47% and evidence for prior inferior scar consistent with his prior MI without associated ischemia.    spoke to patient above result given . Patient verbalized understanding - patient states he still feel fatigue. Patient states he has an appointment in 2 weeks with primary

## 2020-06-20 NOTE — Telephone Encounter (Signed)
Patient returning call for stress test results. 

## 2020-08-09 ENCOUNTER — Other Ambulatory Visit: Payer: Self-pay | Admitting: Cardiovascular Disease

## 2020-08-30 ENCOUNTER — Telehealth (INDEPENDENT_AMBULATORY_CARE_PROVIDER_SITE_OTHER): Payer: Self-pay | Admitting: Gastroenterology

## 2020-08-30 DIAGNOSIS — Z86010 Personal history of colonic polyps: Secondary | ICD-10-CM

## 2020-08-30 MED ORDER — NA SULFATE-K SULFATE-MG SULF 17.5-3.13-1.6 GM/177ML PO SOLN: 1.0000 | Freq: Once | ORAL | 0 refills | Status: AC

## 2020-08-30 NOTE — Progress Notes (Signed)
Gastroenterology Pre-Procedure Review  Request Date: 09/26/20 Requesting Physician: Dr. Allen Norris  PATIENT REVIEW QUESTIONS: The patient responded to the following health history questions as indicated:    1. Are you having any GI issues? no 2. Do you have a personal history of Polyps? yes (04/28/2015) 3. Do you have a family history of Colon Cancer or Polyps? no 4. Diabetes Mellitus? no 5. Joint replacements in the past 12 months?no 6. Major health problems in the past 3 months?no 7. Any artificial heart valves, MVP, or defibrillator?no    MEDICATIONS & ALLERGIES:    Patient reports the following regarding taking any anticoagulation/antiplatelet therapy:   Plavix, Coumadin, Eliquis, Xarelto, Lovenox, Pradaxa, Brilinta, or Effient? yes (Plavix 75 mg) Aspirin? yes (aspirin 81 mg)  Patient confirms/reports the following medications:  Current Outpatient Medications  Medication Sig Dispense Refill   Na Sulfate-K Sulfate-Mg Sulf 17.5-3.13-1.6 GM/177ML SOLN Take 1 kit by mouth once for 1 dose. 354 mL 0   aspirin 81 MG tablet Take 81 mg by mouth daily.     atorvastatin (LIPITOR) 20 MG tablet Take 1 tablet (20 mg total) by mouth daily. 90 tablet 1   clopidogrel (PLAVIX) 75 MG tablet Take 75 mg by mouth daily.     clorazepate (TRANXENE) 7.5 MG tablet Take 7.5 mg by mouth 2 (two) times daily as needed for anxiety. prn     fenofibrate (TRICOR) 145 MG tablet Take 145 mg by mouth daily.     metoprolol succinate (TOPROL-XL) 25 MG 24 hr tablet Take 1 tablet (25 mg total) by mouth daily. 90 tablet 3   nitroGLYCERIN (NITROSTAT) 0.4 MG SL tablet Place 1 tablet (0.4 mg total) under the tongue every 5 (five) minutes as needed for chest pain. 45 tablet 2   pantoprazole (PROTONIX) 40 MG tablet Take 1 tablet (40 mg total) by mouth daily. 30 tablet 3   zolpidem (AMBIEN) 10 MG tablet Take 10 mg by mouth at bedtime as needed.      No current facility-administered medications for this visit.    Patient  confirms/reports the following allergies:  Allergies  Allergen Reactions   Lisinopril Other (See Comments)    Severe chest pain and fatigue   Beta Adrenergic Blockers Other (See Comments)    Hypotension    No orders of the defined types were placed in this encounter.   AUTHORIZATION INFORMATION Primary Insurance: 1D#: Group #:  Secondary Insurance: 1D#: Group #:  SCHEDULE INFORMATION: Date: 09/26/20 Time: Location: [place]

## 2020-09-12 ENCOUNTER — Telehealth: Payer: Self-pay

## 2020-09-12 NOTE — Telephone Encounter (Signed)
   [facility] Pre-operative Risk Assessment    Patient Name: Aaron Kelley  DOB: 01/27/1961  MRN: [id number]   HEARTCARE STAFF: - Please ensure there is not already an duplicate clearance open for this procedure. - Under Visit Info/Reason for Call, type in Other and utilize the format Clearance MM/DD/YY or Clearance TBD. Do not use dashes or single digits. - If request is for dental extraction, please clarify the # of teeth to be extracted. - If the patient is currently at the dentist's office, call Pre-Op APP to address. If the patient is not currently in the dentist office, please route to the Pre-Op pool  Request for surgical clearance:  What type of surgery is being performed? Colonoscopy   When is this surgery scheduled? 09/26/2020   What type of clearance is required (medical clearance vs. Pharmacy clearance to hold med vs. Both)? BOTH   Are there any medications that need to be held prior to surgery and how long? Aspirin, Plavix   Practice name and name of physician performing surgery?  [facility]- Dr. Vicente Males    What is the office phone number? [phone]   7.   What is the office fax number? [phone]   8.   Anesthesia type (None, local, MAC, general) ? Unknown    Ena Dawley 09/12/2020, 5:05 PM  _________________________________________________________________   (provider comments below)

## 2020-09-13 NOTE — Telephone Encounter (Signed)
Dr. Claiborne Billings Pt has a history of PCI in 2014. He recently underwent nuclear stress test for fatigue that was intermediate risk, but no new areas of ischemia.   Can he hold ASA and plavix for colonoscopy?

## 2020-09-20 ENCOUNTER — Telehealth: Payer: Self-pay | Admitting: Cardiovascular Disease

## 2020-09-20 NOTE — Telephone Encounter (Signed)
   Patient Name: Aaron Kelley  DOB: 09-15-1960  MRN: [id number]   Primary Cardiologist: Dr. Shelva Majestic  Chart reviewed as part of pre-operative protocol coverage. Patient recently seen in clinic this spring for fatigue and underwent stress testing which was intermediate risk, EF 47%, findings c/w prior MI, no evidence of ischemia. The patient has a follow-up appointment scheduled tomorrow with Dr. Claiborne Billings to review updated symptoms at which time clearance for holding antiplatelets can be addressed. Added pre-op clearance modifier to appt notes. Will route to Dr. Claiborne Billings to make him aware of need to provide final clearance recs to requesting party below. Will also route to callback team to call requesting party so that they are aware appointment is pending tomorrow as this may affect the duration required to hold antiplatelets. Will otherwise remove from preop box.   Charlie Pitter, PA-C 09/20/2020, 8:46 AM

## 2020-09-20 NOTE — Telephone Encounter (Signed)
Will route back to the requesting surgeon's office to make them aware appointment is pending tomorrow as this may affect the duration required to hold antiplatelets.

## 2020-09-20 NOTE — Telephone Encounter (Signed)
Already addressed in other phone note, will route to callback to assist.

## 2020-09-20 NOTE — Telephone Encounter (Signed)
Follow Up:     Aaron Kelley from [place] called. She wanted to know if pt needs to stop his aspirin and Plavix?

## 2020-09-21 ENCOUNTER — Other Ambulatory Visit: Payer: Self-pay

## 2020-09-21 ENCOUNTER — Ambulatory Visit: Payer: 59 | Admitting: Cardiovascular Disease

## 2020-09-21 ENCOUNTER — Encounter: Payer: Self-pay | Admitting: Cardiovascular Disease

## 2020-09-21 VITALS — BP 112/74 | HR 64 | Ht 73.0 in | Wt 217.0 lb

## 2020-09-21 DIAGNOSIS — Z720 Tobacco use: Secondary | ICD-10-CM

## 2020-09-21 DIAGNOSIS — I252 Old myocardial infarction: Secondary | ICD-10-CM | POA: Diagnosis not present

## 2020-09-21 DIAGNOSIS — E785 Hyperlipidemia, unspecified: Secondary | ICD-10-CM | POA: Diagnosis not present

## 2020-09-21 DIAGNOSIS — K219 Gastro-esophageal reflux disease without esophagitis: Secondary | ICD-10-CM

## 2020-09-21 DIAGNOSIS — I2510 Atherosclerotic heart disease of native coronary artery without angina pectoris: Secondary | ICD-10-CM

## 2020-09-21 NOTE — Telephone Encounter (Signed)
Pt is given clearance for procedure and was advised to hold ASA/Plavix for 5 days

## 2020-09-21 NOTE — Telephone Encounter (Signed)
Clearance faxed to [facility] via Epic

## 2020-09-21 NOTE — Patient Instructions (Signed)

## 2020-09-21 NOTE — Progress Notes (Signed)
Patient ID: PARMINDER CUPPLES, male   DOB: 14-Jul-1960, 60 y.o.   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  Mr. Garmon Dehn has a history of CAD and developed recurrent episodes of chest discomfort associated with significant shortness of breath while at work on 06/03/2012.  He presented to [facility] and initial catheterization was done acutely by [name].  An attempted intervention to his subtotally occluded RCA was unsuccessful and the patient was  transported to [facility] and subsequently underwent successful intervention with placement of two 3.0x24 mm Promus premier drug-eluting stents. He was discharged on aspirin, atorvastatin 80 mg, and Lopressor but he develop significant issues with  fatigability. He saw the physician assistant on several occasions at [facility] and ultimately he was taken off his beta blocker therapy. An echo Doppler study on Jul 23, 2012  showed an ejection fraction of approximately 50% with inferior hypocontractility, as well as mild concentric LVH and grade 1 diastolic dysfunction. Several days later on Jul 28, 2012 due to  recurrent chest discomfort and T-wave changes he underwent repeat cardiac catheterization at [facility]. This showed patent stents in the proximal RCA. He did have a long tubular 30% stenosis in the mid LAD. Other vessels were normal. The patient had subsequently seen Dr. [name] and was referred to me for subsequent post-MI management and care.  When I initially saw him on August 19, 2012 I  recommended an initial trial of ACE inhibitor and started him on low-dose lisinopril. He could not tolerate this small dose and experienced some recurrent chest pain. An NMR  lipoprofile  revealed a calculated LDL of 41 with an LDL particle number of 711. HDL cholesterol was low at 34 and his HDL particle number remains low at 24.9. Triglycerides  are excellent at 80. Insulin resistance was slightly elevated at 52. Hemoglobin was 14.1 hematocrit 41.1 BUN 12 a 1.12. Fasting glucose mildly elevated at 123. I also performed a  P2Y12 assessment of platelet function since he was on Plavix and this revealed adequate platelet response with a value of 191. Vitamin D. level was normal at 50. TSH was 2.602.  A nuclear perfusion study on 05/18/2013 revealed good exercise capacity without chest pain or ECG changes was felt to be low risk with a small area of fixed inferobasal defect, suggesting of a small scar versus diaphragmatic attenuation.  There was no ischemia.  Bood work from 05/10/2013 done by Dr. [name]: Total cholesterol was 115, LDL cholesterol 54, HDL cholesterol, mildly low at 37, and triglycerides of 118.  When I saw him I was concerned about obstructive sleep apnea. His wife commented that he was experiencing very loud snoring.  He admitted that his sleep wa unbearable.  I strongly recommended a sleep evaluation but he has since still not pursued this.  He  developed an episode of chest pain while at work after he was extremely fatigued following a stomach virus which had resulted in significant diarrhea and vomiting leading to dehydration.  He was evaluated at [facility] where he was admitted overnight.  Enzymes were negative.  He subsequent underwent another nuclear perfusion study on 12/29/2013.  He exercised for 8 minutes and 30 seconds and achieved a 10 minute workload and peak heart rate of 148 bpm.  Scintigraphic images again showed a small to medium-sized mild to moderate  intensity fixed perfusion defect in the basal to mid inferior wall consistent with his known prior inferior MI.  Last year he was evaluated in the emergency room with a different chest pain that was more sharp.  Troponin was negative.  He was given a dose of isosorbide 30 mg but has not been on this regularly.  He denies recurrent chest pain  of any similar characteristic to his prior heart attack.  He has been working outside over the summer and heat has led to significant fatigue.  He is unaware of palpitations.  He has continued to be under increased stress.  His mother had passed away.  In addition, his father died in Feb 21, 2016.  His 27 year old son has developed a large tumor on his sciatic nerve and has undergone numerous neurologic evaluations and was referred to the [facility].    He established primary care with Dr. Haskell Riling after Dr. [name] death and subsequently Dr. Jerene Dilling retirement.  He is being monitored for thyroid growth.  He denies any episodes of chest pain or shortness of breath.  He continues to work in [employer] and has had some issues with the extreme heat over the past several weeks.  He denies any episodes of chest pain.  He denies palpitations.  Laboratory by Dr. Francoise Schaumann had shown elevation of triglycerides at 291 and for this reason he was started on fenofibrate to take in addition to atorvastatin.  Most recent laboratory has shown improvement with triglycerides at 95.  His  LDL cholesterol was 60 in May 2019 with a total cholesterol of 118.    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  He developed another episode of [place] spotted fever leading to [facility] ER evaluation in May 2020 for which he was treated with doxycycline.  He sees Dr. Francoise Schaumann every 3 months.  He recently had complete set of laboratory and was told that his labs were excellent although glucose was minimally increased at 102.  He has continued to take atorvastatin 20 mg and fenofibrate 145 mg for his mixed hyperlipidemia.  He was supposed to be taking metoprolol tartrate 12.5 mg twice a day but apparently has been taking 25 mg at bedtime.  He denies any anginal symptoms.  He continues to be on aspirin and Plavix.  He denies bleeding.  He has GERD  currently controlled with pantoprazole.  He denied, palpitations, presyncope or syncope.    I last saw him in March 2022 at which time he was under increased stress.  He had been caring for his grandson for the past 2-1/2 years when his father was expecting his grandson to go back with him.  He has continued to smoke cigarettes at approximately 1/2 pack/day but also dips tobacco.  He has experienced episodes of chest pain which are not typically exertional but can last sometimes all day.  He was evaluated at [facility] ER.  He is unvaccinated with reference to COVID-19 and has no intention to receive any vaccinations.  He is continued to be on aspirin and Plavix for antiplatelet therapy.  He is on atorvastatin 20 mg and fenofibrate for mixed hyperlipidemia.  He has been on metoprolol succinate 25 mg at bedtime.  He has GERD on Protonix.  With his ongoing tobacco use and CAD I recommended he undergo a [place] study for reassessment of LV function and scar/ischemia.  I  discussed the importance of smoking cessation.  He underwent a Lexiscan Myoview study on June 14, 2020.  This showed an EF of 47%.  Findings were compatible with prior inferior myocardial infarction.  There was meeting defect consistent with scar in the basal inferior and mid inferior wall without associated ischemia.  Presently, he is continue to work.  It has been extremely difficult with the intense heat.  He denies any chest pain or shortness of breath.  He is to undergo colonoscopy on September 26, 2020.  He has continued to be on DAPT and will need to hold his Plavix for 5 days prior to the procedure.  He presents for evaluation   Past Medical History:  Diagnosis Date   Anxiety    on chlorazepate, no preventative   Bone spur of left foot    Heel   Coronary artery disease 05/2012   MI tx. @ [facility] 2 stents placed    Diverticulitis    GERD (gastroesophageal reflux disease)    Hypercholesteremia    Laryngopharyngeal  reflux (LPR)    LV dysfunction 07/26/12   EF 50%   MI, old    Mild pulmonic regurgitation and RV dysfunction by prior echocardiogram 07/26/12   S/P cardiac cath 07/28/12   Normal L main, Small ramus, L circumflex: small OM1, large branching OM2, small OM3; LAD: Tiny D1, tiny D2, Tiny D3, long tubular 30% stenosis in mid LAD; RCA: patent stents in proximal RCA, normal PDA, 2 small posterolateral branches    Past Surgical History:  Procedure Laterality Date   [place]     3/14(x2), 5/14   CARDIAC SURGERY     COLONOSCOPY WITH PROPOFOL N/A 04/28/2015   Procedure: COLONOSCOPY WITH PROPOFOL;  Surgeon: Lucilla Lame, MD;  Location: [facility];  Service: Endoscopy;  Laterality: N/A;   POLYPECTOMY  04/28/2015   Procedure: POLYPECTOMY;  Surgeon: Lucilla Lame, MD;  Location: [facility];  Service: Endoscopy;;   rt. ACL surgery  1993   [facility]    Allergies  Allergen Reactions   Lisinopril Other (See Comments)    Severe chest pain and fatigue   Beta Adrenergic Blockers Other (See Comments)    Hypotension    Current Outpatient Medications  Medication Sig Dispense Refill   aspirin 81 MG tablet Take 81 mg by mouth daily.     atorvastatin (LIPITOR) 20 MG tablet Take 1 tablet (20 mg total) by mouth daily. 90 tablet 1   clopidogrel (PLAVIX) 75 MG tablet Take 75 mg by mouth daily.     clorazepate (TRANXENE) 7.5 MG tablet Take 7.5 mg by mouth 2 (two) times daily as needed for anxiety. prn     fenofibrate (TRICOR) 145 MG tablet Take 145 mg by mouth daily.     metoprolol succinate (TOPROL-XL) 25 MG 24 hr tablet Take 1 tablet (25 mg total) by mouth daily. 90 tablet 3   nitroGLYCERIN (NITROSTAT) 0.4 MG SL tablet Place 1 tablet (0.4 mg total) under the tongue every 5 (five) minutes as needed for chest pain. 45 tablet 2   pantoprazole (PROTONIX) 40 MG tablet Take 1 tablet (40 mg total) by mouth daily. 30 tablet 3   zolpidem (AMBIEN) 10 MG tablet Take  10 mg by mouth at bedtime as needed.      No current facility-administered medications for this visit.    Socially he is married. He works as a Oceanographer. He had smoked for 40 years but quit the  day of his heart attack but unfortunately resumed.  Mostly, he did have a history of alcohol and drug use but he quit approximately 14 years ago.  Family History  Problem Relation Age of Onset   Heart disease Father    Heart attack Father    Heart disease Brother    Heart attack Brother    Heart disease Paternal Grandfather    Heart attack Mother     ROS General: Negative; No fevers, chills, or night sweats; Significant fatigue and increased stress HEENT: Negative; No changes in vision or hearing, sinus congestion, difficulty swallowing Pulmonary: Negative; No cough, wheezing, shortness of breath, hemoptysis Cardiovascular: see HPI GI: Negative; No nausea, vomiting, diarrhea, or abdominal pain GU: Negative; No dysuria, hematuria, or difficulty voiding Musculoskeletal: Negative; no myalgias, joint pain, or weakness Hematologic/Oncology: Negative; no easy bruising, bleeding Endocrine: Negative; no heat/cold intolerance; no diabetes Neuro: Negative; no changes in balance, headaches Skin: Negative; No rashes or skin lesions Psychiatric: Negative; No behavioral problems, depression Sleep: Very poor sleep with loud snoring, no daytime sleepiness, hypersomnolence, bruxism, restless legs, hypnogognic hallucinations, no cataplexy; he never pursued evaluation for sleep apnea Other comprehensive 14 point system review is negative.   PE BP 112/74   Pulse 64   Ht '6\' 1"'  (1.854 m)   Wt 217 lb (98.4 kg)   BMI 28.63 kg/m     Repeat blood pressure by me was 116/72  Wt Readings from Last 3 Encounters:  09/21/20 217 lb (98.4 kg)  06/14/20 213 lb (96.6 kg)  06/09/20 213 lb 3.2 oz (96.7 kg)    General: Alert, oriented, no distress.  Skin: normal turgor, no rashes, warm and dry HEENT:  Normocephalic, atraumatic. Pupils equal round and reactive to light; sclera anicteric; extraocular muscles intact;  Nose without nasal septal hypertrophy Mouth/Parynx benign; Mallinpatti scale  Neck: No JVD, no carotid bruits; normal carotid upstroke Lungs: clear to ausculatation and percussion; no wheezing or rales Chest wall: without tenderness to palpitation Heart: PMI not displaced, RRR, s1 s2 normal, 1/6 systolic murmur, no diastolic murmur, no rubs, gallops, thrills, or heaves Abdomen: soft, nontender; no hepatosplenomehaly, BS+; abdominal aorta nontender and not dilated by palpation. Back: no CVA tenderness Pulses 2+ Musculoskeletal: full range of motion, normal strength, no joint deformities Extremities: no clubbing cyanosis or edema, Homan's sign negative  Neurologic: grossly nonfocal; Cranial nerves grossly wnl Psychologic: Normal mood and affect    ECG (independently read by me): NSR at 64; IRBBB, Old Inferior MI ; QTc 445 msec    June 09, 2020 ECG (independently read by me): Sinus rhythm , PVcs ,old Inferior MI; QTc 477  January 2021 ECG (independently read by me): Sinus rhythm with sinus arrhythmia with a ventricular rate at 71 bpm.  Inferior Q waves consistent with old inferior MI.  QTc interval 465 ms.  PR interval 170 ms.  July 2019 ECG (independently read by me): Sinus bradycardia 53 bpm.  Old inferior MI with Q waves in 3 and aVF.  October 2018 ECG (independently read by me): Sinus bradycardia 59 bpm.  Old inferior Q waves in leads 3 and aVF.  No significant ST-T changes.  Normal intervals.  No ectopy.  March 2018 ECG (independently read by me): Sinus bradycardia 53 bpm.  Old inferior Q waves 3 and aVF.  Normal intervals.  No ST segment changes.  May 2017 ECG (independently read by me): Normal sinus rhythm at 64 bpm.  Borderline LVH by voltage criteria.  Old inferior MI with Q  waves.  September 2016 ECG (independently read by me): Normal sinus rhythm at 67 bpm.  Q  waves inferiorly, compatible with his old inferior MI.  No significant ST changes.  May 2016 ECG (independently read by me): Normal sinus rhythm at 73 bpm.  Occasional unifocal PVCs.  November 2015 ECG (independently read by me): Sinus rhythm with PVC.  Old inferior by with Q waves in leads 3 and aVF.  Mild RV conduction delay.  Prior 11/20/2012 ECG: Sinus bradycardia at 57 beats per minute. Evidence for old inferior infarct with Q wave in 3.  T-wave inversion in 3 and F and also T-wave inversion V1 through V4.   BMP Latest Ref Rng & Units 06/04/2020 08/13/2019 08/09/2018  Glucose 70 - 99 mg/dL 118(H) 97 123(H)  BUN 8 - 23 mg/dL '16 14 13  ' Creatinine 0.61 - 1.24 mg/dL 1.24 1.25(H) 1.11  Sodium 135 - 145 mmol/L 136 135 138  Potassium 3.5 - 5.1 mmol/L 3.5 3.8 3.7  Chloride 98 - 111 mmol/L 106 106 107  CO2 22 - 32 mmol/L 23 20(L) 24  Calcium 8.9 - 10.3 mg/dL 9.2 9.4 8.8(L)   Hepatic Function Latest Ref Rng & Units 08/09/2018 03/18/2015 08/19/2014  Total Protein 6.5 - 8.1 g/dL 6.7 6.8 -  Albumin 3.5 - 5.0 g/dL 4.1 4.1 -  AST 15 - 41 U/L 40 19 26  ALT 0 - 44 U/L 54(H) 34 18  Alk Phosphatase 38 - 126 U/L 57 57 65  Total Bilirubin 0.3 - 1.2 mg/dL 0.6 1.0 -   CBC Latest Ref Rng & Units 06/04/2020 08/13/2019 08/09/2018  WBC 4.0 - 10.5 K/uL 7.9 7.7 3.9(L)  Hemoglobin 13.0 - 17.0 g/dL 14.2 14.1 14.3  Hematocrit 39.0 - 52.0 % 42.9 41.1 42.5  Platelets 150 - 400 K/uL 223 202 163   Lab Results  Component Value Date   TSH 2.602 08/20/2012  No results found for: HGBA1C  Lipid Panel     Component Value Date/Time   CHOL 102 08/19/2014 0000   CHOL 100 12/29/2013 0450   TRIG 104 08/19/2014 0000   TRIG 124 12/29/2013 0450   HDL 32 (A) 08/19/2014 0000   HDL 27 (L) 12/29/2013 0450   VLDL 25 12/29/2013 0450   LDLCALC 49 08/19/2014 0000   LDLCALC 48 12/29/2013 0450    RADIOLOGY: No results found.  IMPRESSION:  1. Coronary artery disease involving native coronary artery of native heart without  angina pectoris   2. 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 Hyperlipidemia with target LDL less than 70   4. Tobacco abuse   5. Gastroesophageal reflux disease without esophagitis     ASSESSMENT AND PLAN:   Mr. Nardozzi is a 60 year-old white male who suffered an inferior wall myocardial infarction on 06/03/2012 . PCI was difficult but ultimately successful at [facility] following a failed attempt at [facility] with insertion of 2 DES Promus Premier  stents into his proximal RCA.  He has mild concomitant coronary artery disease with smooth tubular narrowing of his middle LAD of 30%.  He did not tolerate even a very low dose of  ACE inhibition and this may have been due to hypotension particularly in the setting of extreme heat and humidity while working.  When I last saw him in March 2022 he had experienced some episodes of chest discomfort.  His chest pain was not typically exertionally precipitated but he was under significant increased stress at the time.  I recommended  he undergo a [place] study particularly with his ongoing tobacco abuse and concomitant CAD.  I reviewed this with him in detail.  This showed old scar involving the basal inferior and mid inferior wall without associated ischemia.  Post-rest EF is 47%.  He has continued to be on DAPT with aspirin and Plavix.  I have recommended he hold Plavix for 5 days prior to his planned colonoscopy.  His blood pressure today is stable on metoprolol succinate 25 mg.  He continues to be on atorvastatin and fenofibrate for his mixed hyperlipidemia.  GERD is controlled with pantoprazole.  Again discussed importance of smoking cessation.  As long as he remains stable, I will see him in 1 year for follow-up evaluation.   Troy Sine, MD, [facility] 09/23/2020 3:41 PM

## 2020-09-22 ENCOUNTER — Telehealth: Payer: Self-pay

## 2020-09-22 NOTE — Telephone Encounter (Signed)
Called patient to inform him we will have to schedule his procedure. Dr. Claiborne Billings has not responded to the clearance yet. LVM to call office back.

## 2020-09-23 ENCOUNTER — Encounter: Payer: Self-pay | Admitting: Cardiovascular Disease

## 2020-09-26 ENCOUNTER — Encounter: Admission: RE | Payer: Self-pay | Source: Home / Self Care

## 2020-09-26 ENCOUNTER — Ambulatory Visit: Admission: RE | Admit: 2020-09-26 | Payer: 59 | Source: Home / Self Care | Admitting: Gastroenterology

## 2020-09-26 SURGERY — COLONOSCOPY WITH PROPOFOL
Anesthesia: General

## 2020-12-13 IMAGING — CR DG CHEST 2V
1 series · 2 of 2 positions shown · non-contrast
Comparison: August 09, 2018.

CLINICAL DATA: Chest pain

EXAM:
CHEST - 2 VIEW

[Series 1: w chest pa · 0.14mm/px · 2 of 2 slices shown]
[im 1/2]
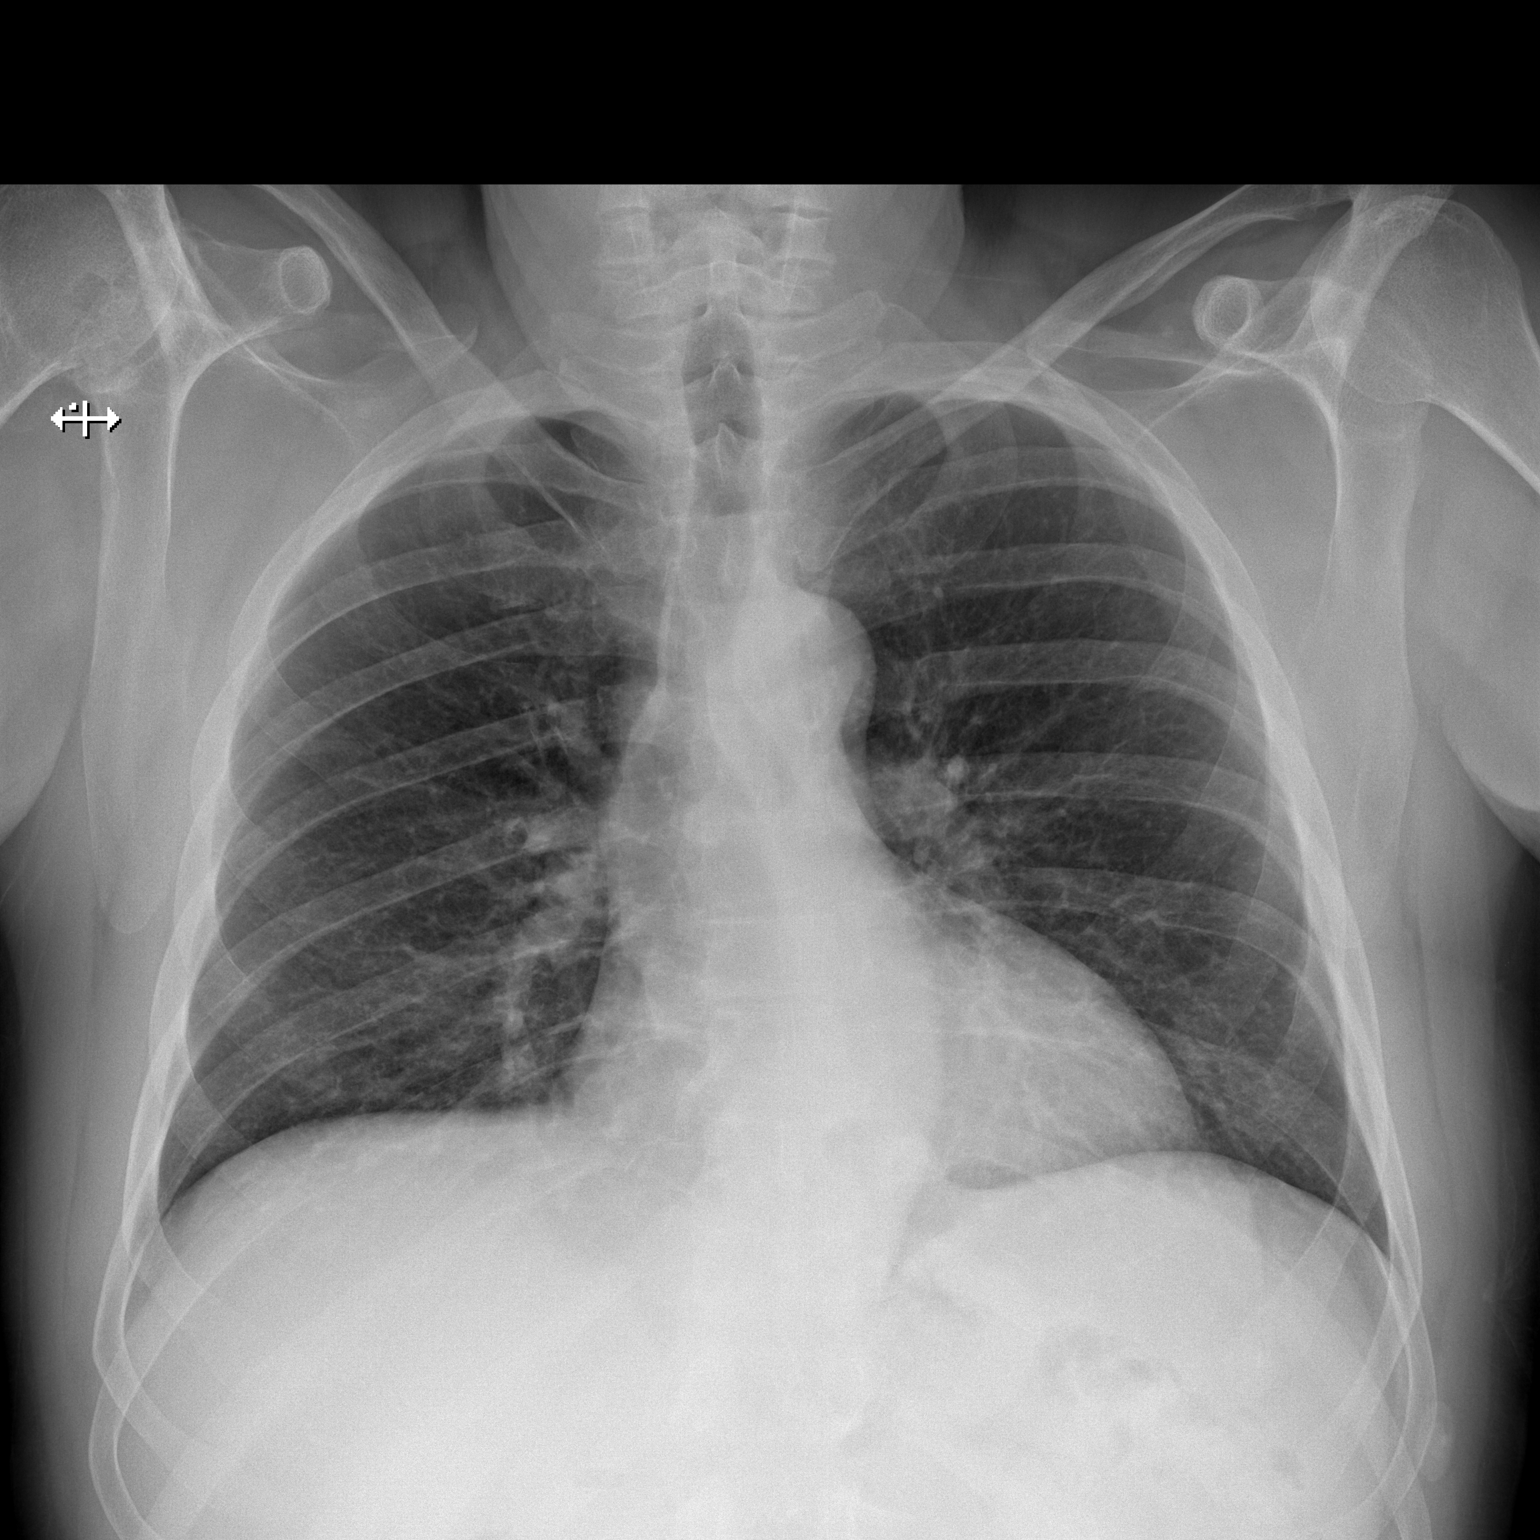
[im 2/2]
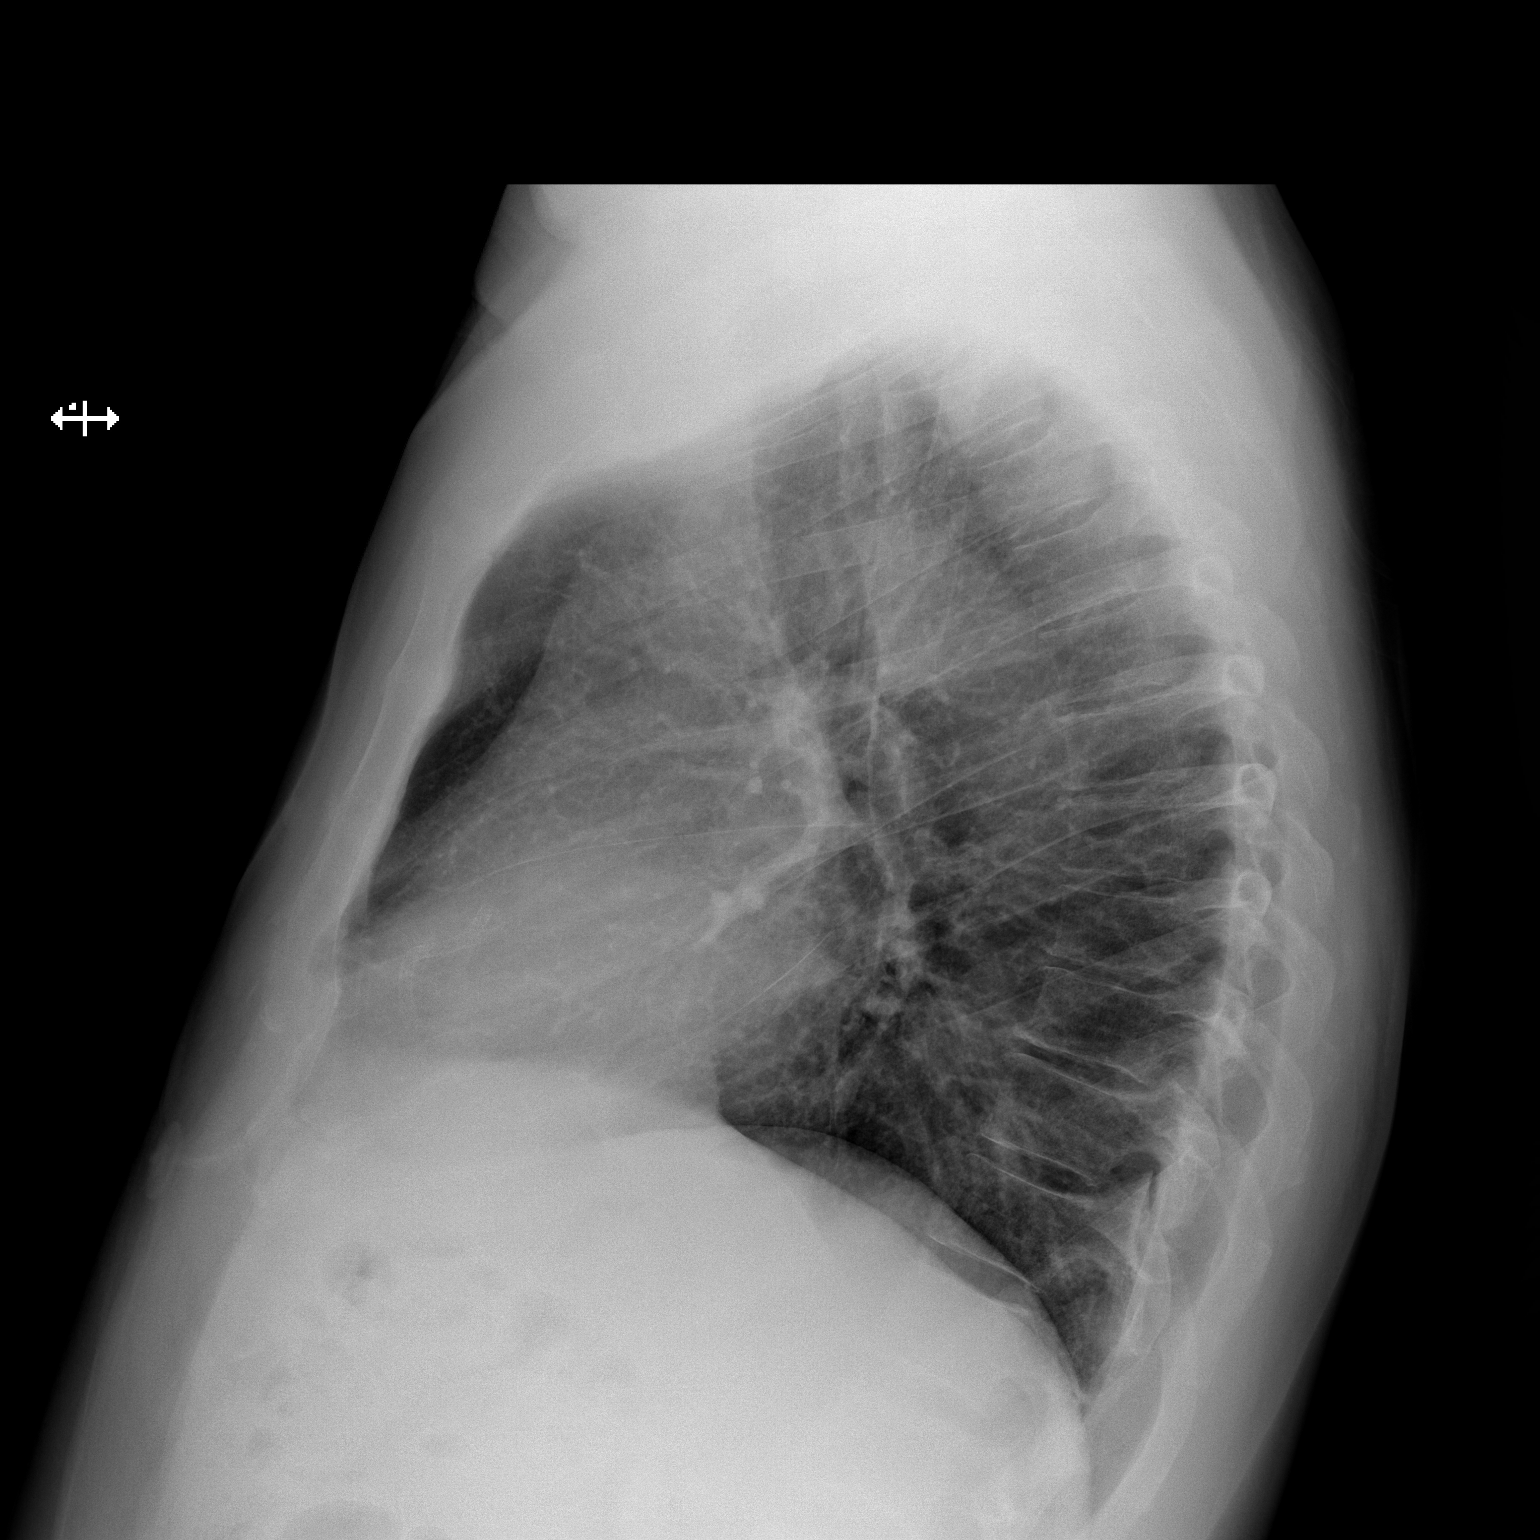

[2 of 2 positions shown; findings below may reference images not displayed]

FINDINGS: Lungs are clear. Heart is upper normal in size with pulmonary
vascularity normal. No adenopathy. No bone lesions. Suspected small
hiatal hernia.
IMPRESSION: Lungs clear. Heart upper normal in size. Suspected small hiatal
hernia.

## 2021-08-15 ENCOUNTER — Other Ambulatory Visit: Payer: Self-pay | Admitting: Cardiovascular Disease

## 2021-10-05 IMAGING — DX DG CHEST 1V
1 series · 1 of 1 positions shown · non-contrast
Comparison: Chest radiograph dated 08/13/2019.

CLINICAL DATA: 59-year-old male with chest pain.

EXAM:
CHEST  1 VIEW

[chest ap]
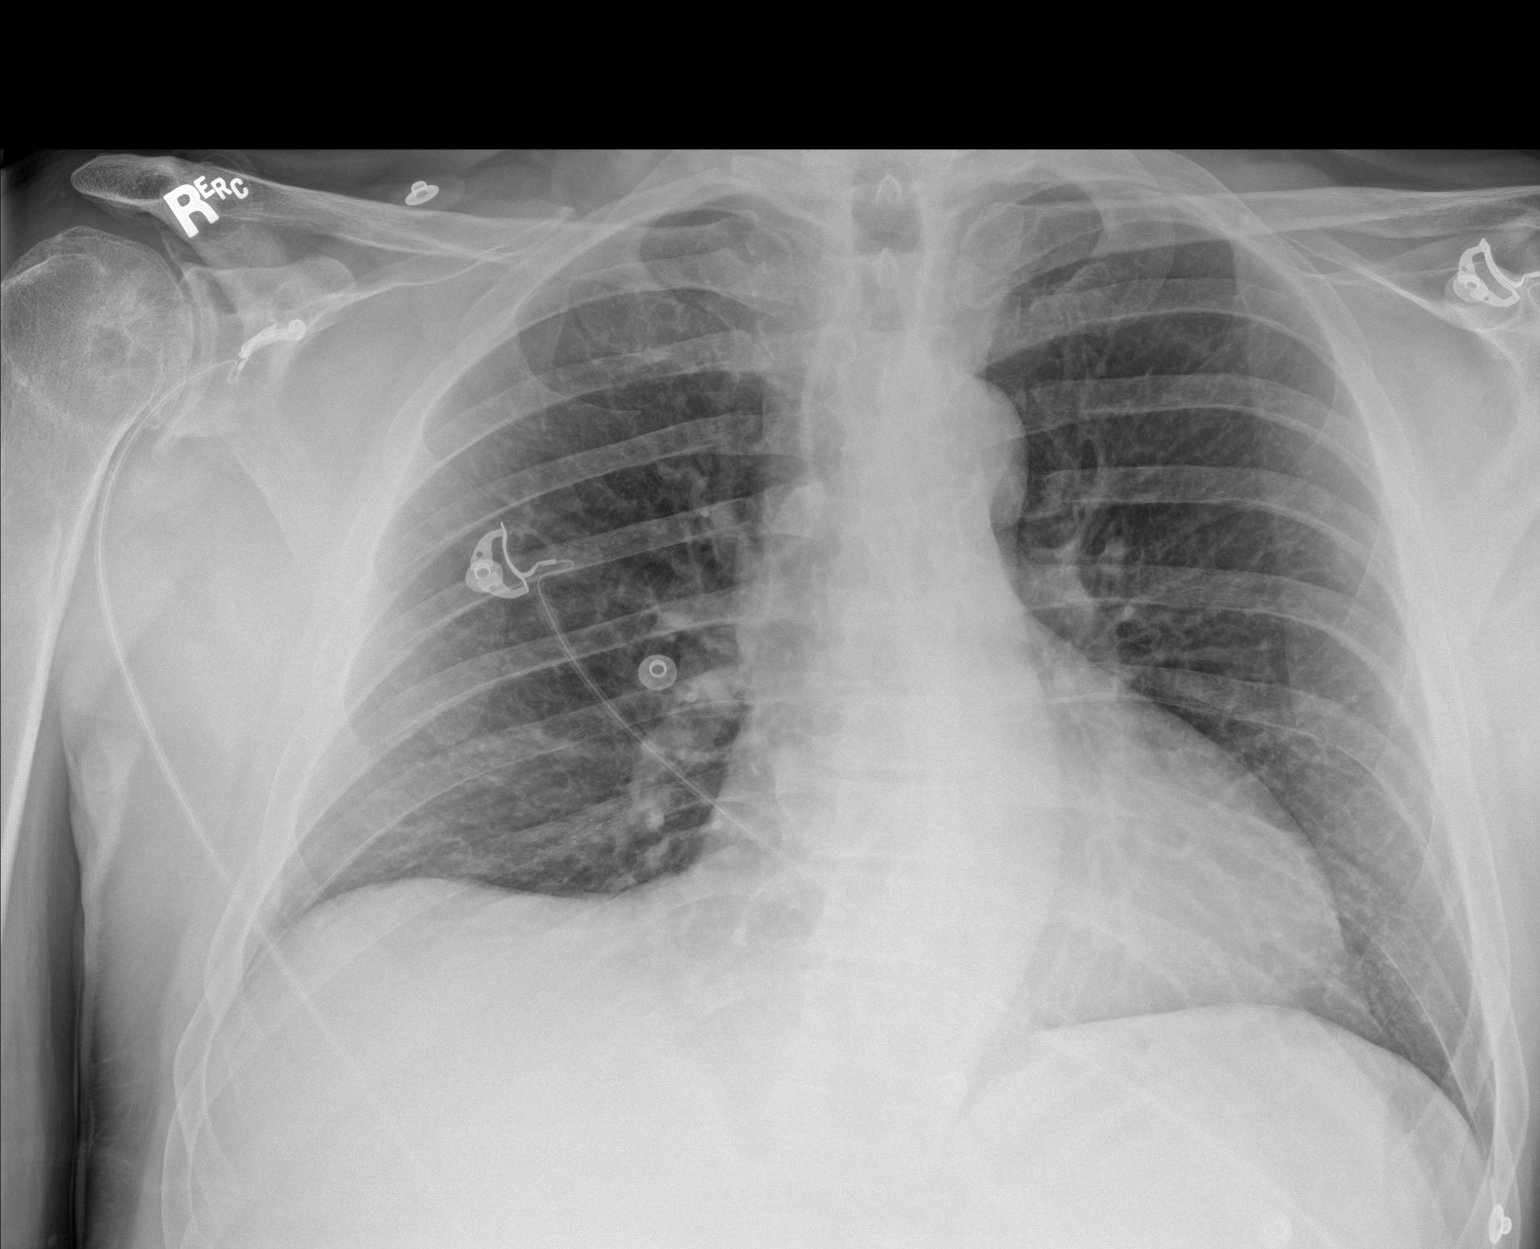

[1 of 1 positions shown; findings below may reference images not displayed]

FINDINGS: No focal consolidation, pleural effusion or pneumothorax. The
cardiac silhouette is within limits. No acute osseous pathology.
Degenerative changes of the right shoulder.
IMPRESSION: No active disease.

## 2021-11-20 ENCOUNTER — Other Ambulatory Visit: Payer: Self-pay | Admitting: Internal Medicine

## 2021-12-26 ENCOUNTER — Other Ambulatory Visit: Payer: Self-pay | Admitting: Cardiovascular Disease

## 2021-12-26 NOTE — Telephone Encounter (Signed)
*  STAT* If patient is at the pharmacy, call can be transferred to refill team.   1. Which medications need to be refilled? (please list name of each medication and dose if known) metoprolol succinate (TOPROL-XL) 25 MG 24 hr tablet  2. Which pharmacy/location (including street and city if local pharmacy) is medication to be sent to? [place] [place]  3. Do they need a 30 day or 90 day supply? 90 day  Patient is out of medication Has appt in January with Dr. Claiborne Billings.

## 2022-04-17 ENCOUNTER — Encounter: Payer: Self-pay | Admitting: Cardiovascular Disease

## 2022-04-17 ENCOUNTER — Ambulatory Visit: Payer: 59 | Attending: Cardiovascular Disease | Admitting: Cardiovascular Disease

## 2022-04-17 VITALS — BP 120/82 | HR 61 | Ht 73.5 in | Wt 223.2 lb

## 2022-04-17 DIAGNOSIS — Z720 Tobacco use: Secondary | ICD-10-CM | POA: Diagnosis not present

## 2022-04-17 DIAGNOSIS — I252 Old myocardial infarction: Secondary | ICD-10-CM | POA: Diagnosis not present

## 2022-04-17 DIAGNOSIS — E785 Hyperlipidemia, unspecified: Secondary | ICD-10-CM | POA: Diagnosis not present

## 2022-04-17 DIAGNOSIS — I2510 Atherosclerotic heart disease of native coronary artery without angina pectoris: Secondary | ICD-10-CM | POA: Diagnosis not present

## 2022-04-17 DIAGNOSIS — K219 Gastro-esophageal reflux disease without esophagitis: Secondary | ICD-10-CM

## 2022-04-17 NOTE — Patient Instructions (Signed)
Medication Instructions:  Your physician recommends that you continue on your current medications as directed. Please refer to the Current Medication list given to you today.  *If you need a refill on your cardiac medications before your next appointment, please call your pharmacy*  Follow-Up: At [facility], you and your health needs are our priority.  As part of our continuing mission to provide you with exceptional heart care, we have created designated Provider Care Teams.  These Care Teams include your primary Cardiologist (physician) and Advanced Practice Providers (APPs -  Physician Assistants and Nurse Practitioners) who all work together to provide you with the care you need, when you need it.  We recommend signing up for the patient portal called "MyChart".  Sign up information is provided on this After Visit Summary.  MyChart is used to connect with patients for Virtual Visits (Telemedicine).  Patients are able to view lab/test results, encounter notes, upcoming appointments, etc.  Non-urgent messages can be sent to your provider as well.   To learn more about what you can do with MyChart, go to [email or web address].    Your next appointment:   12 month(s)  Provider:   Dr. Claiborne Billings

## 2022-04-17 NOTE — Progress Notes (Unsigned)
Patient ID: MIHAILO SAGE, male   DOB: 27-Jun-1960, 62 y.o.   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  Mr. Neilson Oehlert has a history of CAD and developed recurrent episodes of chest discomfort associated with significant shortness of breath while at work on 06/03/2012.  He presented to [facility] and initial catheterization was done acutely by [name].  An attempted intervention to his subtotally occluded RCA was unsuccessful and the patient was  transported to [facility] and subsequently underwent successful intervention with placement of two 3.0x24 mm Promus premier drug-eluting stents. He was discharged on aspirin, atorvastatin 80 mg, and Lopressor but he develop significant issues with  fatigability. He saw the physician assistant on several occasions at [facility] and ultimately he was taken off his beta blocker therapy. An echo Doppler study on Jul 23, 2012  showed an ejection fraction of approximately 50% with inferior hypocontractility, as well as mild concentric LVH and grade 1 diastolic dysfunction. Several days later on Jul 28, 2012 due to  recurrent chest discomfort and T-wave changes he underwent repeat cardiac catheterization at [facility]. This showed patent stents in the proximal RCA. He did have a long tubular 30% stenosis in the mid LAD. Other vessels were normal. The patient had subsequently seen Dr. [name] and was referred to me for subsequent post-MI management and care.  When I initially saw him on August 19, 2012 I  recommended an initial trial of ACE inhibitor and started him on low-dose lisinopril. He could not tolerate this small dose and experienced some recurrent chest pain. An NMR  lipoprofile  revealed a calculated LDL of 41 with an LDL particle number of 711. HDL cholesterol was low at 34 and his HDL particle number remains low at 24.9. Triglycerides  are excellent at 80. Insulin resistance was slightly elevated at 52. Hemoglobin was 14.1 hematocrit 41.1 BUN 12 a 1.12. Fasting glucose mildly elevated at 123. I also performed a  P2Y12 assessment of platelet function since he was on Plavix and this revealed adequate platelet response with a value of 191. Vitamin D. level was normal at 50. TSH was 2.602.  A nuclear perfusion study on 05/18/2013 revealed good exercise capacity without chest pain or ECG changes was felt to be low risk with a small area of fixed inferobasal defect, suggesting of a small scar versus diaphragmatic attenuation.  There was no ischemia.  Bood work from 05/10/2013 done by Dr. [name]: Total cholesterol was 115, LDL cholesterol 54, HDL cholesterol, mildly low at 37, and triglycerides of 118.  When I saw him I was concerned about obstructive sleep apnea. His wife commented that he was experiencing very loud snoring.  He admitted that his sleep wa unbearable.  I strongly recommended a sleep evaluation but he has since still not pursued this.  He developed an episode of chest pain while at work after he was extremely fatigued following a stomach virus which had resulted in significant diarrhea and vomiting leading to dehydration.  He was evaluated at [facility] where he was admitted overnight.  Enzymes were negative.  He subsequent underwent another nuclear perfusion study on 12/29/2013.  He exercised for 8 minutes and 30 seconds and achieved a 10 minute workload and peak heart rate of 148 bpm.  Scintigraphic images again showed a small to medium-sized mild to moderate  intensity fixed perfusion defect in the basal to mid inferior wall consistent with his known prior inferior MI.  Last year he was evaluated in the emergency room with a different chest pain that was more sharp.  Troponin was negative.  He was given a dose of isosorbide 30 mg but has not been on this regularly.  He denies recurrent chest pain  of any similar characteristic to his prior heart attack.  He has been working outside over the summer and heat has led to significant fatigue.  He is unaware of palpitations.  He has continued to be under increased stress.  His mother had passed away.  In addition, his father died in 04-Mar-2016.  His 43 year old son has developed a large tumor on his sciatic nerve and has undergone numerous neurologic evaluations and was referred to the [facility].    He established primary care with Dr. Haskell Riling after Dr. [name] death and subsequently Dr. Jerene Dilling retirement.  He is being monitored for thyroid growth.  He denies any episodes of chest pain or shortness of breath.  He continues to work in [employer] and has had some issues with the extreme heat over the past several weeks.  He denies any episodes of chest pain.  He denies palpitations.  Laboratory by Dr. Francoise Schaumann had shown elevation of triglycerides at 291 and for this reason he was started on fenofibrate to take in addition to atorvastatin.  Most recent laboratory has shown improvement with triglycerides at 95.  His  LDL cholesterol was 60 in May 2019 with a total cholesterol of 118.    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  He developed another episode of [place] spotted fever leading to [facility] ER evaluation in May 2020 for which he was treated with doxycycline.  He sees Dr. Francoise Schaumann every 3 months.  He recently had complete set of laboratory and was told that his labs were excellent although glucose was minimally increased at 102.  He has continued to take atorvastatin 20 mg and fenofibrate 145 mg for his mixed hyperlipidemia.  He was supposed to be taking metoprolol tartrate 12.5 mg twice a day but apparently has been taking 25 mg at bedtime.  He denies any anginal symptoms.  He continues to be on aspirin and Plavix.  He denies bleeding.  He has GERD  currently controlled with pantoprazole.  He denied, palpitations, presyncope or syncope.    I saw him in March 2022 at which time he was under increased stress.  He had been caring for his grandson for the past 2-1/2 years when his father was expecting his grandson to go back with him.  He has continued to smoke cigarettes at approximately 1/2 pack/day but also dips tobacco.  He has experienced episodes of chest pain which are not typically exertional but can last sometimes all day.  He was evaluated at [facility].  He is unvaccinated with reference to COVID-19 and has no intention to receive any vaccinations.  He is continued to be on aspirin and Plavix for antiplatelet therapy.  He is on atorvastatin 20 mg and fenofibrate for mixed hyperlipidemia.  He has been on metoprolol succinate 25 mg at bedtime.  He has GERD on Protonix.  With his ongoing tobacco use and CAD I recommended he undergo a [place] study for reassessment of LV function and scar/ischemia.  I discussed  the importance of smoking cessation.  He underwent a Lexiscan Myoview study on June 14, 2020.  This showed an EF of 47%.  Findings were compatible with prior inferior myocardial infarction.  There was meeting defect consistent with scar in the basal inferior and mid inferior wall without associated ischemia.  I last saw him on September 21, 2020.  Presently, he is continue to work.  It has been extremely difficult with the intense heat.  He denies any chest pain or shortness of breath.  He is to undergo colonoscopy on September 26, 2020.  He has continued to be on DAPT and will need to hold his Plavix for 5 days prior to the procedure.  I again discussed importance of smoking cessation.  Over the past year, he has undergone every 3 months laboratory by Dr. Francoise Schaumann.  He tells me laboratory 2 weeks ago was excellent.  His most recent lipid studies showed a total cholesterol of 120, triglycerides 73, HDL 41, VLDL 15, LDL 64.  Creatinine  was 1.17, GFR 77.  He denies chest pain shortness of breath or palpitations.  He continues to work and often does extra work on Saturdays.  He presents for evaluation.   Past Medical History:  Diagnosis Date   Anxiety    on chlorazepate, no preventative   Bone spur of left foot    Heel   Coronary artery disease 05/2012   MI tx. @ [facility] 2 stents placed    Diverticulitis    GERD (gastroesophageal reflux disease)    Hypercholesteremia    Laryngopharyngeal reflux (LPR)    LV dysfunction 07/26/12   EF 50%   MI, old    Mild pulmonic regurgitation and RV dysfunction by prior echocardiogram 07/26/12   S/P cardiac cath 07/28/12   Normal L main, Small ramus, L circumflex: small OM1, large branching OM2, small OM3; LAD: Tiny D1, tiny D2, Tiny D3, long tubular 30% stenosis in mid LAD; RCA: patent stents in proximal RCA, normal PDA, 2 small posterolateral branches    Past Surgical History:  Procedure Laterality Date   [place]     3/14(x2), 5/14   CARDIAC SURGERY     COLONOSCOPY WITH PROPOFOL N/A 04/28/2015   Procedure: COLONOSCOPY WITH PROPOFOL;  Surgeon: Lucilla Lame, MD;  Location: [place];  Service: Endoscopy;  Laterality: N/A;   POLYPECTOMY  04/28/2015   Procedure: POLYPECTOMY;  Surgeon: Lucilla Lame, MD;  Location: [place];  Service: Endoscopy;;   rt. ACL surgery  1993   [facility]    Allergies  Allergen Reactions   Lisinopril Other (See Comments)    Severe chest pain and fatigue   Beta Adrenergic Blockers Other (See Comments)    Hypotension    Current Outpatient Medications  Medication Sig Dispense Refill   aspirin 81 MG tablet Take 81 mg by mouth daily.     atorvastatin (LIPITOR) 20 MG tablet Take 1 tablet (20 mg total) by mouth daily. 90 tablet 1   clopidogrel (PLAVIX) 75 MG tablet Take 75 mg by mouth daily.     clorazepate (TRANXENE) 7.5 MG tablet Take 7.5 mg by mouth 2 (two) times daily as needed for anxiety. prn      fenofibrate (TRICOR) 145 MG tablet Take 145 mg by mouth daily.     metoprolol succinate (TOPROL-XL) 25 MG 24 hr tablet TAKE 1 TABLET BY MOUTH EVERY NIGHT AT BEDTIME 90 tablet 3   nitroGLYCERIN (NITROSTAT) 0.4 MG SL tablet  Place 1 tablet (0.4 mg total) under the tongue every 5 (five) minutes as needed for chest pain. 45 tablet 2   pantoprazole (PROTONIX) 40 MG tablet Take 1 tablet (40 mg total) by mouth daily. 30 tablet 3   zolpidem (AMBIEN) 10 MG tablet Take 10 mg by mouth at bedtime as needed.      No current facility-administered medications for this visit.    Socially he is married. He works as a Oceanographer. He had smoked for 40 years but quit the day of his heart attack but unfortunately resumed.  Mostly, he did have a history of alcohol and drug use but he quit approximately 14 years ago.  Family History  Problem Relation Age of Onset   Heart disease Father    Heart attack Father    Heart disease Brother    Heart attack Brother    Heart disease Paternal Grandfather    Heart attack Mother     ROS General: Negative; No fevers, chills, or night sweats; Significant fatigue and increased stress HEENT: Negative; No changes in vision or hearing, sinus congestion, difficulty swallowing Pulmonary: Negative; No cough, wheezing, shortness of breath, hemoptysis Cardiovascular: see HPI GI: Negative; No nausea, vomiting, diarrhea, or abdominal pain GU: Negative; No dysuria, hematuria, or difficulty voiding Musculoskeletal: Negative; no myalgias, joint pain, or weakness Hematologic/Oncology: Negative; no easy bruising, bleeding Endocrine: Negative; no heat/cold intolerance; no diabetes Neuro: Negative; no changes in balance, headaches Skin: Negative; No rashes or skin lesions Psychiatric: Negative; No behavioral problems, depression Sleep: Very poor sleep with loud snoring, no daytime sleepiness, hypersomnolence, bruxism, restless legs, hypnogognic hallucinations, no cataplexy; he never  pursued evaluation for sleep apnea Other comprehensive 14 point system review is negative.   PE BP 120/82   Pulse 61   Ht 6' 1.5" (1.867 m)   Wt 223 lb 3.2 oz (101.2 kg)   SpO2 98%   BMI 29.05 kg/m     Repeat blood pressure by me was 110/74  Wt Readings from Last 3 Encounters:  04/17/22 223 lb 3.2 oz (101.2 kg)  09/21/20 217 lb (98.4 kg)  06/14/20 213 lb (96.6 kg)   General: Alert, oriented, no distress.  Skin: normal turgor, no rashes, warm and dry HEENT: Normocephalic, atraumatic. Pupils equal round and reactive to light; sclera anicteric; extraocular muscles intact;  Nose without nasal septal hypertrophy Mouth/Parynx benign; Mallinpatti scale 2/3 Neck: No JVD, no carotid bruits; normal carotid upstroke Lungs: clear to ausculatation and percussion; no wheezing or rales Chest wall: without tenderness to palpitation Heart: PMI not displaced, RRR, s1 s2 normal, 1/6 systolic murmur, no diastolic murmur, no rubs, gallops, thrills, or heaves Abdomen: soft, nontender; no hepatosplenomehaly, BS+; abdominal aorta nontender and not dilated by palpation. Back: no CVA tenderness Pulses 2+ Musculoskeletal: full range of motion, normal strength, no joint deformities Extremities: no clubbing cyanosis or edema, Homan's sign negative  Neurologic: grossly nonfocal; Cranial nerves grossly wnl Psychologic: Normal mood and affect   April 17, 2022 ECG (independently read by me):  Sinus rhythm at 61, [facility], old inferior MI   September 21, 2020 ECG (independently read by me): NSR at 64; IRBBB, Old Inferior MI ; QTc 445 msec    June 09, 2020 ECG (independently read by me): Sinus rhythm , PVcs ,old Inferior MI; QTc 477  January 2021 ECG (independently read by me): Sinus rhythm with sinus arrhythmia with a ventricular rate at 71 bpm.  Inferior Q waves consistent with old inferior MI.  QTc interval 465 ms.  PR interval 170 ms.  July 2019 ECG (independently read by me): Sinus bradycardia 53 bpm.  Old  inferior MI with Q waves in 3 and aVF.  October 2018 ECG (independently read by me): Sinus bradycardia 59 bpm.  Old inferior Q waves in leads 3 and aVF.  No significant ST-T changes.  Normal intervals.  No ectopy.  March 2018 ECG (independently read by me): Sinus bradycardia 53 bpm.  Old inferior Q waves 3 and aVF.  Normal intervals.  No ST segment changes.  May 2017 ECG (independently read by me): Normal sinus rhythm at 64 bpm.  Borderline LVH by voltage criteria.  Old inferior MI with Q waves.  September 2016 ECG (independently read by me): Normal sinus rhythm at 67 bpm.  Q waves inferiorly, compatible with his old inferior MI.  No significant ST changes.  May 2016 ECG (independently read by me): Normal sinus rhythm at 73 bpm.  Occasional unifocal PVCs.  November 2015 ECG (independently read by me): Sinus rhythm with PVC.  Old inferior by with Q waves in leads 3 and aVF.  Mild RV conduction delay.  Prior 11/20/2012 ECG: Sinus bradycardia at 57 beats per minute. Evidence for old inferior infarct with Q wave in 3.  T-wave inversion in 3 and F and also T-wave inversion V1 through V4.      Latest Ref Rng & Units 06/04/2020    8:48 PM 08/13/2019   12:01 PM 08/09/2018    7:45 PM  BMP  Glucose 70 - 99 mg/dL 118  97  123   BUN 8 - 23 mg/dL '16  14  13   '$ Creatinine 0.61 - 1.24 mg/dL 1.24  1.25  1.11   Sodium 135 - 145 mmol/L 136  135  138   Potassium 3.5 - 5.1 mmol/L 3.5  3.8  3.7   Chloride 98 - 111 mmol/L 106  106  107   CO2 22 - 32 mmol/L '23  20  24   '$ Calcium 8.9 - 10.3 mg/dL 9.2  9.4  8.8       Latest Ref Rng & Units 08/09/2018    7:45 PM 03/18/2015   12:33 PM 08/19/2014   12:00 AM  Hepatic Function  Total Protein 6.5 - 8.1 g/dL 6.7  6.8    Albumin 3.5 - 5.0 g/dL 4.1  4.1    AST 15 - 41 U/L 40  19  26      ALT 0 - 44 U/L 54  34  18      Alk Phosphatase 38 - 126 U/L 57  57  65      Total Bilirubin 0.3 - 1.2 mg/dL 0.6  1.0       This result is from an external source.      Latest  Ref Rng & Units 06/04/2020    8:48 PM 08/13/2019   12:01 PM 08/09/2018    7:45 PM  CBC  WBC 4.0 - 10.5 K/uL 7.9  7.7  3.9   Hemoglobin 13.0 - 17.0 g/dL 14.2  14.1  14.3   Hematocrit 39.0 - 52.0 % 42.9  41.1  42.5   Platelets 150 - 400 K/uL 223  202  163    Lab Results  Component Value Date   TSH 2.602 08/20/2012  No results found for: "HGBA1C"  Lipid Panel     Component Value Date/Time   CHOL 102 08/19/2014 0000   CHOL 100 12/29/2013 0450   TRIG 104 08/19/2014 0000  TRIG 124 12/29/2013 0450   HDL 32 (A) 08/19/2014 0000   HDL 27 (L) 12/29/2013 0450   VLDL 25 12/29/2013 0450   LDLCALC 49 08/19/2014 0000   LDLCALC 48 12/29/2013 0450    RADIOLOGY: No results found.  IMPRESSION:  1. Coronary artery disease involving native coronary artery of native heart without angina pectoris   2. 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 Hyperlipidemia with target LDL less than 70   4. Tobacco abuse   5. Gastroesophageal reflux disease without esophagitis     ASSESSMENT AND PLAN:   Mr. Keech is a 62 year-old white male who suffered an inferior wall myocardial infarction on 06/03/2012 . PCI was difficult but ultimately successful at [facility] following a failed attempt at [facility] with insertion of 2 DES Promus Premier  stents into his proximal RCA.  He has mild concomitant coronary artery disease with smooth tubular narrowing of his middle LAD of 30%.  He did not tolerate even a very low dose of  ACE inhibition and this may have been due to hypotension particularly in the setting of extreme heat and humidity while working.  When I last saw him in March 2022 he had experienced some episodes of chest discomfort.  His chest pain was not typically exertionally precipitated but he was under significant increased stress at the time.  [place] study showed old scar involving the basal inferior and mid inferior wall without associated ischemia.  Post-rest EF was  47%.  Presently he remains asymptomatic without chest pain, shortness of breath or palpitations.  He has been on long-term DAPT with aspirin/Plavix and tolerates this well.  He is on atorvastatin 20 mg and fenofibrate 145 mg for lipids.  Most recent lipid studies were excellent with total cholesterol 120, triglycerides 73, VLDL 15, HDL 41 and LDL 64.  He takes pantoprazole 40 mg for GERD.  He continues to be on low-dose metoprolol succinate 25 mg at bedtime and denies palpitations.  He will continue current medical regimen.  I will see him in 1 year for reevaluation or sooner as needed.    Troy Sine, MD, [facility] 04/18/2022 4:19 PM

## 2022-04-18 ENCOUNTER — Encounter: Payer: Self-pay | Admitting: Cardiovascular Disease

## 2022-10-08 ENCOUNTER — Other Ambulatory Visit: Payer: Self-pay | Admitting: Cardiovascular Disease

## 2023-06-17 ENCOUNTER — Telehealth: Payer: Self-pay | Admitting: *Deleted

## 2023-06-17 NOTE — Telephone Encounter (Signed)
   Name: Aaron Kelley  DOB: 1960-04-12  MRN: [id number]  Primary Cardiologist: None  Chart reviewed as part of pre-operative protocol coverage. Because of Loris Winrow Sather's past medical history and time since last visit, he will require a follow-up in-office visit in order to better assess preoperative cardiovascular risk.  Pre-op covering staff: - Please schedule appointment and call patient to inform them. If patient already had an upcoming appointment within acceptable timeframe, please add "pre-op clearance" to the appointment notes so provider is aware. - Please contact requesting surgeon's office via preferred method (i.e, phone, fax) to inform them of need for appointment prior to surgery.  Would prefer to not stop antiplatelet agents, however if bleeding risk is too high can stop Plavix x 5 days prior to procedure and resume when medically safe to do so.  Sharlene Dory, PA-C  06/17/2023, 1:30 PM

## 2023-06-17 NOTE — Telephone Encounter (Signed)
   Pre-operative Risk Assessment    Patient Name: Aaron Kelley  DOB: Apr 06, 1960 MRN: [id number]   Date of last office visit: 03-20-2022 Date of next office visit: 03-2023 NOT SCHEDULED   Request for Surgical Clearance    Procedure:   CLEANING  FILLINGS AND CROWN   2-3 TEETH   Date of Surgery:  Clearance TBD                                 Surgeon:  DR [place] THOMAS Surgeon's Group or Practice Name:  [facility]  Phone number:  [phone] Fax number:  [phone]   Type of Clearance Requested:   - Pharmacy:  Hold Clopidogrel (Plavix) APPROPRIATE DAYS  TO HOLD   Type of Anesthesia:  General    Signed, Sandford Diop M   06/17/2023, 11:11 AM

## 2023-06-18 NOTE — Telephone Encounter (Signed)
 Pt has been scheduled to see Edd Fabian, FNP 06/19/23 @ 1:55 preop clearance. I will update all parties involved. Pt asked if Dr. Tresa Endo was retiring this yr. I answered yes sir. Pt states he would like to know if Dr. Royann Shivers would take him on after Dr. Rivka Barbara. I suggested to the pt that he can discuss this at this appt tomorrow.

## 2023-06-20 NOTE — Progress Notes (Unsigned)
 Cardiology Clinic Note   Patient Name: Aaron Kelley Date of Encounter: 06/24/2023  Primary Care Provider:  Alan Mulder, MD Primary Cardiologist: Dr. Tresa Endo  Patient Profile    Aaron Kelley 63 year old male presents to the clinic today for follow-up evaluation of his coronary artery disease, hyperlipidemia, and preoperative cardiac evaluation.  Past Medical History    Past Medical History:  Diagnosis Date   Anxiety    on chlorazepate, no preventative   Bone spur of left foot    Heel   Coronary artery disease 05/2012   MI tx. @ [facility] 2 stents placed    Diverticulitis    GERD (gastroesophageal reflux disease)    Hypercholesteremia    Laryngopharyngeal reflux (LPR)    LV dysfunction 07/26/12   EF 50%   MI, old    Mild pulmonic regurgitation and RV dysfunction by prior echocardiogram 07/26/12   S/P cardiac cath 07/28/12   Normal L main, Small ramus, L circumflex: small OM1, large branching OM2, small OM3; LAD: Tiny D1, tiny D2, Tiny D3, long tubular 30% stenosis in mid LAD; RCA: patent stents in proximal RCA, normal PDA, 2 small posterolateral branches   Past Surgical History:  Procedure Laterality Date   APPENDECTOMY  1972   CARDIAC CATHETERIZATION     3/14(x2), 5/14   CARDIAC SURGERY     COLONOSCOPY WITH PROPOFOL N/A 04/28/2015   Procedure: COLONOSCOPY WITH PROPOFOL;  Surgeon: Midge Minium, MD;  Location: [facility];  Service: Endoscopy;  Laterality: N/A;   POLYPECTOMY  04/28/2015   Procedure: POLYPECTOMY;  Surgeon: Midge Minium, MD;  Location: [facility];  Service: Endoscopy;;   rt. ACL surgery  1993   [facility]    Allergies  Allergies  Allergen Reactions   Lisinopril Other (See Comments)    Severe chest pain and fatigue   Beta Adrenergic Blockers Other (See Comments)    Hypotension    History of Present Illness    Aaron Kelley has a PMH of coronary artery disease, hyperlipidemia, tobacco abuse, GERD, anxiety, and old  MI.  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  He underwent PCI at [facility] following a failed attempt at [facility] with insertion of DES x 2 to his proximal RCA.  He was also noted to have mid LAD 30% stenosis.  He was not able to tolerate low-dose ACE due to hypotension.  He works as a Soil scientist and his symptoms were exacerbated by heat and humidity.  3/22 he experienced some episodes of chest discomfort.  His pain was not exertional.  It was exacerbated by stress.  He underwent nuclear stress test which showed old scar involving his basal inferior and mid inferior wall without associated ischemia.  He was continued on dual antiplatelet therapy.  He was continued on atorvastatin and fenofibrate.  He was last seen by Dr. Tresa Endo 04/17/2022.  During that time he remained stable from a cardiac standpoint.  He denied chest pain, shortness of breath and palpitations.  He continued to take pantoprazole for his reflux.  Follow-up in 1 year was planned.  He presents to the clinic today for follow-up evaluation and states he continues to play golf regularly.  His blood pressure is well-controlled today.  We reviewed his previous cardiology visit.  He remains stable from a cardiac standpoint.  I will refill his metoprolol for a year and plan follow-up in 1 year.  Today he denies chest pain, shortness of breath, lower extremity edema, fatigue,  palpitations, melena, hematuria, hemoptysis, diaphoresis, weakness, presyncope, syncope, orthopnea, and PND.    Home Medications    Prior to Admission medications   Medication Sig Start Date End Date Taking? Authorizing Provider  aspirin 81 MG tablet Take 81 mg by mouth daily.    [provider]  atorvastatin (LIPITOR) 20 MG tablet Take 1 tablet (20 mg total) by mouth daily. 07/04/15   Olevia Perches P, DO  clopidogrel (PLAVIX) 75 MG tablet Take 75 mg by mouth daily.    [provider]  clorazepate (TRANXENE) 7.5 MG tablet Take 7.5 mg by mouth 2  (two) times daily as needed for anxiety. prn    [provider]  fenofibrate (TRICOR) 145 MG tablet Take 145 mg by mouth daily.    [provider]  metoprolol succinate (TOPROL-XL) 25 MG 24 hr tablet TAKE ONE TABLET BY MOUTH EVERY NIGHT AT BEDTIME 10/08/22   Lennette Bihari, MD  nitroGLYCERIN (NITROSTAT) 0.4 MG SL tablet Place 1 tablet (0.4 mg total) under the tongue every 5 (five) minutes as needed for chest pain. 08/11/19   Lennette Bihari, MD  pantoprazole (PROTONIX) 40 MG tablet Take 1 tablet (40 mg total) by mouth daily. 06/28/15   Johnson, Megan P, DO  zolpidem (AMBIEN) 10 MG tablet Take 10 mg by mouth at bedtime as needed.  03/30/15   [provider]    Family History    Family History  Problem Relation Age of Onset   Heart disease Father    Heart attack Father    Heart disease Brother    Heart attack Brother    Heart disease Paternal Grandfather    Heart attack Mother    He indicated that his mother is alive. He indicated that his father is alive. He indicated that his brother is alive. He indicated that his maternal grandmother is deceased. He indicated that his maternal grandfather is deceased. He indicated that his paternal grandmother is deceased. He indicated that his paternal grandfather is deceased.  Social History    Social History   Socioeconomic History   Marital status: Married    Spouse name: Not on file   Number of children: Not on file   Years of education: Not on file   Highest education level: Not on file  Occupational History   Not on file  Tobacco Use   Smoking status: Former    Current packs/day: 0.00    Average packs/day: 1 pack/day for 35.0 years (35.0 ttl pk-yrs)    Types: Cigarettes    Start date: 06/03/1977    Quit date: 06/03/2012    Years since quitting: 11.0   Smokeless tobacco: Current    Types: Snuff   Tobacco comments:    1/2 can/day  Vaping Use   Vaping status: Never Used  Substance and Sexual Activity    Alcohol use: No   Drug use: No   Sexual activity: Not on file  Other Topics Concern   Not on file  Social History Narrative   Not on file   Social Drivers of Health   Financial Resource Strain: Not on file  Food Insecurity: Not on file  Transportation Needs: Not on file  Physical Activity: Not on file  Stress: Not on file  Social Connections: Not on file  Intimate Partner Violence: Not on file     Review of Systems    General:  No chills, fever, night sweats or weight changes.  Cardiovascular:  No chest pain, dyspnea on  exertion, edema, orthopnea, palpitations, paroxysmal nocturnal dyspnea. Dermatological: No rash, lesions/masses Respiratory: No cough, dyspnea Urologic: No hematuria, dysuria Abdominal:   No nausea, vomiting, diarrhea, bright red blood per rectum, melena, or hematemesis Neurologic:  No visual changes, wkns, changes in mental status. All other systems reviewed and are otherwise negative except as noted above.  Physical Exam    VS:  BP 120/62 (BP Location: Right Arm, Patient Position: Sitting, Cuff Size: Large)   Pulse 71   Ht 6\' 1"  (1.854 m)   Wt 223 lb (101.2 kg)   SpO2 99%   BMI 29.42 kg/m  , BMI Body mass index is 29.42 kg/m. GEN: Well nourished, well developed, in no acute distress. HEENT: normal. Neck: Supple, no JVD, carotid bruits, or masses. Cardiac: RRR, no murmurs, rubs, or gallops. No clubbing, cyanosis, edema.  Radials/DP/PT 2+ and equal bilaterally.  Respiratory:  Respirations regular and unlabored, clear to auscultation bilaterally. GI: Soft, nontender, nondistended, BS + x 4. MS: no deformity or atrophy. Skin: warm and dry, no rash. Neuro:  Strength and sensation are intact. Psych: Normal affect.  Accessory Clinical Findings    Recent Labs: No results found for requested labs within last 365 days.   Recent Lipid Panel    Component Value Date/Time   CHOL 102 08/19/2014 0000   CHOL 100 12/29/2013 0450   TRIG 104 08/19/2014 0000    TRIG 124 12/29/2013 0450   HDL 32 (A) 08/19/2014 0000   HDL 27 (L) 12/29/2013 0450   VLDL 25 12/29/2013 0450   LDLCALC 49 08/19/2014 0000   LDLCALC 48 12/29/2013 0450         ECG personally reviewed by me today-  EKG Interpretation Date/Time:  Tuesday June 24 2023 14:06:08 EDT Ventricular Rate:  72 PR Interval:  206 QRS Duration:  106 QT Interval:  420 QTC Calculation: 459 R Axis:   -16  Text Interpretation: Sinus rhythm with sinus arrhythmia with frequent Premature ventricular complexes Incomplete right bundle branch block Confirmed by Edd Fabian [phone]) on 06/24/2023 2:07:27 PM         Assessment & Plan   1.  Coronary artery disease-denies recent episodes of exertional chest discomfort or anginal symptoms.  Nuclear stress testing showed old scar and EF of 47%. Heart healthy low-sodium diet Maintain physical activity Continue aspirin, atorvastatin, Plavix, fenofibrate, metoprolol  Hyperlipidemia-follows with PCP High-fiber diet Continue fenofibrate, aspirin, atorvastatin Maintain physical activity  PVCs-EKG today shows Sinus rhythm with sinus arrhythmia with frequent Premature ventricular complexes Incomplete right bundle branch block 72 bpm. Avoid triggers caffeine, chocolate, EtOH, dehydration etc. Continue metoprolol  Preoperative cardiac evaluation-Procedure:   CLEANING  FILLINGS AND CROWN   2-3 TEETH    Date of Surgery:  Clearance TBD                                  Surgeon:  DR [place] THOMAS Surgeon's Group or Practice Name:  [facility]  Phone number:  [phone] Fax number:  [phone]    Primary Cardiologist: Dr. Tresa Endo  Chart reviewed as part of pre-operative protocol coverage. Given past medical history and time since last visit, based on ACC/AHA guidelines, JASMON GRAFFAM would be at acceptable risk for the planned procedure without further cardiovascular testing.   Patient was advised that if he develops new symptoms prior to  surgery to contact our office to arrange a follow-up appointment.  He verbalized understanding.  Would prefer  to not stop antiplatelet agents, however if bleeding risk is too high can stop Plavix x 5 days prior to procedure and resume when medically safe to do so.   He does not require SBE prophylaxis.  I will route this recommendation to the requesting party via Epic fax function and remove from pre-op pool.    Disposition: Follow-up with Dr. Royann Shivers or me in 12 months.    Thomasene Ripple. Donyale Berthold NP-C     06/24/2023, 2:18 PM [facility] [address] Office [phone] Fax [phone]    I spent 14 minutes examining this patient, reviewing medications, and using patient centered shared decision making involving their cardiac care.   I spent  20 minutes reviewing past medical history,  medications, and prior cardiac tests.

## 2023-06-24 ENCOUNTER — Encounter: Payer: Self-pay | Admitting: General Practice

## 2023-06-24 ENCOUNTER — Ambulatory Visit: Attending: General Practice | Admitting: General Practice

## 2023-06-24 VITALS — BP 120/62 | HR 71 | Ht 73.0 in | Wt 223.0 lb

## 2023-06-24 DIAGNOSIS — I493 Ventricular premature depolarization: Secondary | ICD-10-CM | POA: Diagnosis not present

## 2023-06-24 DIAGNOSIS — I2510 Atherosclerotic heart disease of native coronary artery without angina pectoris: Secondary | ICD-10-CM

## 2023-06-24 DIAGNOSIS — Z01810 Encounter for preprocedural cardiovascular examination: Secondary | ICD-10-CM | POA: Diagnosis not present

## 2023-06-24 DIAGNOSIS — I2583 Coronary atherosclerosis due to lipid rich plaque: Secondary | ICD-10-CM

## 2023-06-24 DIAGNOSIS — I2119 ST elevation (STEMI) myocardial infarction involving other coronary artery of inferior wall: Secondary | ICD-10-CM | POA: Diagnosis not present

## 2023-06-24 MED ORDER — METOPROLOL SUCCINATE ER 25 MG PO TB24: 25.0000 mg | ORAL_TABLET | Freq: Every day | ORAL | 3 refills | Status: AC

## 2023-06-24 NOTE — Patient Instructions (Addendum)
 Medication Instructions:  The current medical regimen is effective;  continue present plan and medications as directed. Please refer to the Current Medication list given to you today.  *If you need a refill on your cardiac medications before your next appointment, please call your pharmacy*  Lab Work: WILL REQUEST LAB FROM [facility]  Testing/Procedures: NONE  Follow-Up: At [facility], you and your health needs are our priority.  As part of our continuing mission to provide you with exceptional heart care, our providers are all part of one team.  This team includes your primary Cardiologist (physician) and Advanced Practice Providers or APPs (Physician Assistants and Nurse Practitioners) who all work together to provide you with the care you need, when you need it.  Your next appointment:   12 month(s)  Provider:   Reatha Harps, MD    Other Instructions PLEASE READ AND FOLLOW ATTACHED  SALTY 6  MAINTAIN YOUR PHYSICAL ACTIVITY CLEARED FOR DENTAL PROCEDURE          1st Floor: - Lobby - Registration  - Pharmacy  - Lab - Cafe  2nd Floor: - PV Lab - Diagnostic Testing (echo, CT, nuclear med)  3rd Floor: - Vacant  4th Floor: - TCTS (cardiothoracic surgery) - AFib Clinic - Structural Heart Clinic - Vascular Surgery  - Vascular Ultrasound  5th Floor: - HeartCare Cardiology (general and EP) - Clinical Pharmacy for coumadin, hypertension, lipid, weight-loss medications, and med management appointments    Valet parking services will be available as well.

## 2023-09-08 ENCOUNTER — Other Ambulatory Visit: Payer: Self-pay | Admitting: Cardiovascular Disease

## 2023-09-09 ENCOUNTER — Telehealth: Payer: Self-pay | Admitting: Cardiovascular Disease

## 2023-09-09 MED ORDER — NITROGLYCERIN 0.4 MG SL SUBL
0.4000 mg | SUBLINGUAL_TABLET | SUBLINGUAL | 2 refills | Status: AC | PRN
Start: 2023-09-09 — End: ?

## 2023-09-09 NOTE — Telephone Encounter (Signed)
*  STAT* If patient is at the pharmacy, call can be transferred to refill team.   1. Which medications need to be refilled? (please list name of each medication and dose if known) nitroGLYCERIN  (NITROSTAT ) 0.4 MG SL tablet    4. Which pharmacy/location (including street and city if local pharmacy) is medication to be sent to?  [facility] - [place], [place] - [address] Phone: [phone]  Fax: [phone]       5. Do they need a 30 day or 90 day supply? 90

## 2023-09-09 NOTE — Telephone Encounter (Signed)
 Pt's medication was sent to pt's pharmacy as requested. Confirmation received.

## 2023-12-01 ENCOUNTER — Telehealth: Payer: Self-pay | Admitting: Cardiovascular Disease

## 2023-12-01 NOTE — Telephone Encounter (Signed)
 Dental office calling to follow up about patients clearance. He has had a follow up appointment since. Please advise.

## 2023-12-02 NOTE — Telephone Encounter (Signed)
 Spoke with surgeon's office to inform them that formal preop clearance request is needed. They will be sending over via fax. Will keep a look out for clearance.

## 2023-12-11 NOTE — Telephone Encounter (Signed)
 I s/w Aaron Kelley at [facility] . I reviewed the chart while speaking with Aaron Kelley. I do see that we received a clearance request from their office 06/17/23. I confirmed this was the clearance request in question. I apologized this was not handled correctly last week while I was on vacation.   Aaron Kelley was seen by Josefa Beauvais, FNP and was cleared at that time. I read the notes to [facility] and recommendations. I assured Aaron Kelley that I will fax these notes to their office today.   Aaron Kelley did tell me there procedure that will be done 1st will be the deep cleaning. Our office will need a clearance request when ready for any other dental procedure due to Aaron Kelley is on a blood thinner.   I will run this past Josefa Beauvais, FNP who cleared the Aaron Kelley to be sure all information here is correct.

## 2023-12-11 NOTE — Telephone Encounter (Signed)
 Me    12/11/23  9:29 AM Note I s/w Aaron Kelley at [facility] . I reviewed the chart while speaking with Aaron Kelley. I do see that we received a clearance request from their office 06/17/23. I confirmed this was the clearance request in question. I apologized this was not handled correctly last week while I was on vacation.    Pt was seen by Josefa Beauvais, FNP and was cleared at that time. I read the notes to [facility] and recommendations. I assured Aaron Kelley that I will fax these notes to their office today.    Aaron Kelley did tell me there procedure that will be done 1st will be the deep cleaning. Our office will need a clearance request when ready for any other dental procedure due to pt is on a blood thinner.    I will run this past Josefa Beauvais, FNP who cleared the pt to be sure all information here is correct.       12/11/23  9:24 AM You contacted Aaron Kelley- [facility]  Gerome Ival SAILOR, CMA    12/02/23 10:39 AM Note Spoke with surgeon's office to inform them that formal preop clearance request is needed. They will be sending over via fax. Will keep a look out for clearance.       12/01/23  4:41 PM Neysa Mort routed this conversation to [facility] Preop Callback (Selected Message)  Neysa Mort [facility]   12/01/23  4:41 PM Note Dental office calling to follow up about patients clearance. He has had a follow up appointment since. Please advise.         12/01/23  4:40 PM Aaron Kelley- [facility] contacted Neysa Mort
